# Patient Record
Sex: Male | Born: 1950 | Race: White | Hispanic: No | Marital: Married | Smoking: Former smoker
Health system: Southern US, Community
[De-identification: ages and names within clinical notes are randomized; demographics above are authoritative.]

## PROBLEM LIST (undated history)

## (undated) DIAGNOSIS — J986 Disorders of diaphragm: Secondary | ICD-10-CM

## (undated) DIAGNOSIS — F329 Major depressive disorder, single episode, unspecified: Secondary | ICD-10-CM

## (undated) DIAGNOSIS — E119 Type 2 diabetes mellitus without complications: Secondary | ICD-10-CM

## (undated) DIAGNOSIS — E785 Hyperlipidemia, unspecified: Secondary | ICD-10-CM

## (undated) DIAGNOSIS — I219 Acute myocardial infarction, unspecified: Secondary | ICD-10-CM

## (undated) DIAGNOSIS — I2510 Atherosclerotic heart disease of native coronary artery without angina pectoris: Secondary | ICD-10-CM

## (undated) HISTORY — DX: Acute myocardial infarction, unspecified: I21.9

## (undated) HISTORY — PX: CARDIAC CATHETERIZATION: SHX172

## (undated) HISTORY — DX: Disorders of diaphragm: J98.6

## (undated) HISTORY — DX: Atherosclerotic heart disease of native coronary artery without angina pectoris: I25.10

## (undated) HISTORY — DX: Hyperlipidemia, unspecified: E78.5

## (undated) HISTORY — DX: Major depressive disorder, single episode, unspecified: F32.9

---

## 1993-09-23 DIAGNOSIS — F32A Depression, unspecified: Secondary | ICD-10-CM

## 1993-09-23 HISTORY — DX: Depression, unspecified: F32.A

## 1993-09-23 HISTORY — PX: CORONARY ARTERY BYPASS GRAFT: SHX141

## 1998-09-09 ENCOUNTER — Encounter: Payer: Self-pay | Admitting: Emergency Medicine

## 1998-09-09 ENCOUNTER — Inpatient Hospital Stay (HOSPITAL_COMMUNITY): Admission: EM | Admit: 1998-09-09 | Discharge: 1998-09-12 | Payer: Self-pay | Admitting: Emergency Medicine

## 1998-09-13 ENCOUNTER — Encounter: Payer: Self-pay | Admitting: Cardiology

## 1998-09-13 ENCOUNTER — Observation Stay (HOSPITAL_COMMUNITY): Admission: EM | Admit: 1998-09-13 | Discharge: 1998-09-14 | Payer: Self-pay | Admitting: Emergency Medicine

## 1998-11-11 ENCOUNTER — Encounter: Payer: Self-pay | Admitting: Emergency Medicine

## 1998-11-11 ENCOUNTER — Inpatient Hospital Stay (HOSPITAL_COMMUNITY): Admission: EM | Admit: 1998-11-11 | Discharge: 1998-11-13 | Payer: Self-pay | Admitting: Emergency Medicine

## 1998-11-12 ENCOUNTER — Encounter: Payer: Self-pay | Admitting: Cardiology

## 1998-11-14 ENCOUNTER — Observation Stay (HOSPITAL_COMMUNITY): Admission: AD | Admit: 1998-11-14 | Discharge: 1998-11-15 | Payer: Self-pay | Admitting: Cardiology

## 2001-01-25 ENCOUNTER — Encounter: Payer: Self-pay | Admitting: Emergency Medicine

## 2001-01-25 ENCOUNTER — Emergency Department (HOSPITAL_COMMUNITY): Admission: EM | Admit: 2001-01-25 | Discharge: 2001-01-25 | Payer: Self-pay | Admitting: Emergency Medicine

## 2004-05-07 ENCOUNTER — Ambulatory Visit (HOSPITAL_COMMUNITY): Admission: RE | Admit: 2004-05-07 | Discharge: 2004-05-07 | Payer: Self-pay | Admitting: Family Medicine

## 2007-05-19 ENCOUNTER — Ambulatory Visit (HOSPITAL_BASED_OUTPATIENT_CLINIC_OR_DEPARTMENT_OTHER): Admission: RE | Admit: 2007-05-19 | Discharge: 2007-05-19 | Payer: Self-pay | Admitting: Cardiology

## 2007-05-25 ENCOUNTER — Ambulatory Visit: Payer: Self-pay | Admitting: Internal Medicine

## 2007-06-11 ENCOUNTER — Encounter: Payer: Self-pay | Admitting: Cardiology

## 2008-06-01 ENCOUNTER — Ambulatory Visit (HOSPITAL_BASED_OUTPATIENT_CLINIC_OR_DEPARTMENT_OTHER): Admission: RE | Admit: 2008-06-01 | Discharge: 2008-06-01 | Payer: Self-pay | Admitting: Internal Medicine

## 2008-06-04 ENCOUNTER — Ambulatory Visit: Payer: Self-pay | Admitting: Internal Medicine

## 2008-08-09 ENCOUNTER — Ambulatory Visit (HOSPITAL_BASED_OUTPATIENT_CLINIC_OR_DEPARTMENT_OTHER): Admission: RE | Admit: 2008-08-09 | Discharge: 2008-08-09 | Payer: Self-pay | Admitting: Internal Medicine

## 2008-08-12 ENCOUNTER — Ambulatory Visit: Payer: Self-pay | Admitting: Internal Medicine

## 2009-05-14 ENCOUNTER — Inpatient Hospital Stay (HOSPITAL_COMMUNITY): Admission: EM | Admit: 2009-05-14 | Discharge: 2009-05-18 | Payer: Self-pay | Admitting: Emergency Medicine

## 2009-06-01 ENCOUNTER — Encounter: Payer: Self-pay | Admitting: Cardiology

## 2009-06-08 ENCOUNTER — Encounter (HOSPITAL_COMMUNITY): Admission: RE | Admit: 2009-06-08 | Discharge: 2009-08-22 | Payer: Self-pay | Admitting: Cardiovascular Disease

## 2009-06-29 DIAGNOSIS — E785 Hyperlipidemia, unspecified: Secondary | ICD-10-CM

## 2009-07-03 ENCOUNTER — Encounter: Payer: Self-pay | Admitting: Cardiology

## 2009-07-10 ENCOUNTER — Encounter: Payer: Self-pay | Admitting: Cardiology

## 2009-07-20 ENCOUNTER — Telehealth (INDEPENDENT_AMBULATORY_CARE_PROVIDER_SITE_OTHER): Payer: Self-pay | Admitting: *Deleted

## 2009-07-26 ENCOUNTER — Ambulatory Visit: Payer: Self-pay | Admitting: Cardiology

## 2009-09-20 ENCOUNTER — Encounter (INDEPENDENT_AMBULATORY_CARE_PROVIDER_SITE_OTHER): Payer: Self-pay | Admitting: *Deleted

## 2009-09-29 ENCOUNTER — Ambulatory Visit: Payer: Self-pay | Admitting: Internal Medicine

## 2009-09-29 DIAGNOSIS — E559 Vitamin D deficiency, unspecified: Secondary | ICD-10-CM | POA: Insufficient documentation

## 2009-09-29 DIAGNOSIS — H6980 Other specified disorders of Eustachian tube, unspecified ear: Secondary | ICD-10-CM

## 2009-09-29 DIAGNOSIS — F329 Major depressive disorder, single episode, unspecified: Secondary | ICD-10-CM | POA: Insufficient documentation

## 2009-09-29 DIAGNOSIS — N4289 Other specified disorders of prostate: Secondary | ICD-10-CM | POA: Insufficient documentation

## 2009-11-27 ENCOUNTER — Encounter: Admission: RE | Admit: 2009-11-27 | Discharge: 2009-11-27 | Payer: Self-pay | Admitting: Cardiovascular Disease

## 2009-11-28 ENCOUNTER — Ambulatory Visit (HOSPITAL_COMMUNITY): Admission: RE | Admit: 2009-11-28 | Discharge: 2009-11-29 | Payer: Self-pay | Admitting: Cardiovascular Disease

## 2009-11-30 ENCOUNTER — Encounter (HOSPITAL_COMMUNITY): Admission: RE | Admit: 2009-11-30 | Discharge: 2010-01-22 | Payer: Self-pay | Admitting: Cardiovascular Disease

## 2010-02-23 ENCOUNTER — Encounter: Payer: Self-pay | Admitting: Internal Medicine

## 2010-02-26 ENCOUNTER — Ambulatory Visit: Payer: Self-pay | Admitting: Internal Medicine

## 2010-02-26 DIAGNOSIS — H9190 Unspecified hearing loss, unspecified ear: Secondary | ICD-10-CM | POA: Insufficient documentation

## 2010-02-28 ENCOUNTER — Inpatient Hospital Stay (HOSPITAL_COMMUNITY): Admission: RE | Admit: 2010-02-28 | Discharge: 2010-03-01 | Payer: Self-pay | Admitting: Cardiovascular Disease

## 2010-05-10 ENCOUNTER — Encounter: Payer: Self-pay | Admitting: Internal Medicine

## 2010-05-15 ENCOUNTER — Telehealth (INDEPENDENT_AMBULATORY_CARE_PROVIDER_SITE_OTHER): Payer: Self-pay | Admitting: *Deleted

## 2010-05-16 ENCOUNTER — Ambulatory Visit: Payer: Self-pay | Admitting: Internal Medicine

## 2010-05-18 ENCOUNTER — Telehealth (INDEPENDENT_AMBULATORY_CARE_PROVIDER_SITE_OTHER): Payer: Self-pay | Admitting: *Deleted

## 2010-05-18 LAB — CONVERTED CEMR LAB: Hgb A1c MFr Bld: 8.8 % — ABNORMAL HIGH (ref 4.6–6.5)

## 2010-05-22 ENCOUNTER — Ambulatory Visit: Payer: Self-pay | Admitting: Internal Medicine

## 2010-05-22 DIAGNOSIS — IMO0001 Reserved for inherently not codable concepts without codable children: Secondary | ICD-10-CM

## 2010-07-10 ENCOUNTER — Encounter: Payer: Self-pay | Admitting: Internal Medicine

## 2010-07-13 ENCOUNTER — Ambulatory Visit: Payer: Self-pay | Admitting: Internal Medicine

## 2010-07-13 ENCOUNTER — Telehealth: Payer: Self-pay | Admitting: Internal Medicine

## 2010-07-16 LAB — CONVERTED CEMR LAB
CO2: 29 meq/L (ref 19–32)
Creatinine,U: 145.2 mg/dL
Hgb A1c MFr Bld: 6.2 % (ref 4.6–6.5)
Microalb Creat Ratio: 0.4 mg/g (ref 0.0–30.0)
Microalb, Ur: 0.6 mg/dL (ref 0.0–1.9)
Potassium: 4.7 meq/L (ref 3.5–5.1)
Sodium: 142 meq/L (ref 135–145)

## 2010-07-25 ENCOUNTER — Ambulatory Visit: Payer: Self-pay | Admitting: Internal Medicine

## 2010-07-25 DIAGNOSIS — E1159 Type 2 diabetes mellitus with other circulatory complications: Secondary | ICD-10-CM | POA: Insufficient documentation

## 2010-08-23 ENCOUNTER — Encounter: Payer: Self-pay | Admitting: Internal Medicine

## 2010-08-30 ENCOUNTER — Inpatient Hospital Stay (HOSPITAL_COMMUNITY): Admission: EM | Admit: 2010-08-30 | Discharge: 2010-06-15 | Payer: Self-pay | Admitting: Emergency Medicine

## 2010-10-18 ENCOUNTER — Ambulatory Visit: Admit: 2010-10-18 | Payer: Self-pay | Admitting: Internal Medicine

## 2010-10-22 ENCOUNTER — Ambulatory Visit
Admission: RE | Admit: 2010-10-22 | Discharge: 2010-10-22 | Payer: Self-pay | Source: Home / Self Care | Attending: Internal Medicine | Admitting: Internal Medicine

## 2010-10-22 ENCOUNTER — Other Ambulatory Visit: Payer: Self-pay | Admitting: Internal Medicine

## 2010-10-25 ENCOUNTER — Ambulatory Visit: Admit: 2010-10-25 | Payer: Self-pay | Admitting: Internal Medicine

## 2010-10-25 ENCOUNTER — Encounter: Payer: Self-pay | Admitting: Internal Medicine

## 2010-10-25 ENCOUNTER — Ambulatory Visit (INDEPENDENT_AMBULATORY_CARE_PROVIDER_SITE_OTHER): Payer: Commercial Managed Care - PPO | Admitting: Internal Medicine

## 2010-10-25 DIAGNOSIS — E119 Type 2 diabetes mellitus without complications: Secondary | ICD-10-CM

## 2010-10-25 NOTE — Assessment & Plan Note (Signed)
Summary: to discuss labs///sph   Vital Signs:  Patient profile:   60 year old male Weight:      170.6 pounds BMI:     28.49 Pulse rate:   60 / minute Resp:     12 per minute BP sitting:   98 / 58  (left arm) Cuff size:   large  Vitals Entered By: Shonna Chock CMA (July 25, 2010 9:15 AM) CC: Follow-up visit: discuss labs, Type 2 diabetes mellitus follow-up   Primary Care Provider:  Dr. Dayton Scrape.[facility]  CC:  Follow-up visit: discuss labs and Type 2 diabetes mellitus follow-up.  History of Present Illness: Type 2 Diabetes Mellitus Follow-Up      This is a 60 year old man who presents for Type 2 diabetes mellitus follow-up.  The patient reports self managed hypoglycemia(he stopped Metformin XL 500 mg in mid Sept pre catheterization & has restrted it), but denies polyuria, polydipsia, blurred vision, weight loss, weight gain, and numbness of extremities.  Other symptoms include chest pain  6 weeks ago  ; S/P angioplasty. Brachytherapy & angioplasty 07/18/2010 by Dr Gloriajean Dell, [facility]. The patient denies the following symptoms: neuropathic pain, vomiting, orthostatic symptoms, poor wound healing, intermittent claudication, vision loss, and foot ulcer.  Since the last visit the patient reports good dietary compliance ( no HFCS & hyperglycemic carbs).  The patient has been measuring capillary blood glucose before breakfast( 100 -133),  2 hrs after lunch or  after dinner ( < 140).  Macular Degeneration diagnosed by Dr Burgess Estelle, [facility], 06/2010. A1c 6.2% ( average sugar 132 & 24% risk); A1c initially was 8.6%( average sugar 200 & risk 72%).  Allergies (verified): No Known Drug Allergies  Review of Systems Psych:  Complains of easily angered, easily tearful, and irritability; denies anxiety and panic attacks; "I'm in a funk". Dr Jennelle Human last seen 02/2010.  Physical Exam  General:  well-nourished;alert,appropriate and cooperative throughout examination Lungs:  Normal  respiratory effort, chest expands symmetrically. Lungs are clear to auscultation, no crackles or wheezes. Heart:  regular rhythm, no murmur, no gallop, no rub, no JVD, no HJR, and bradycardia.   Abdomen:  Bowel sounds positive,abdomen soft and non-tender without masses, organomegaly or hernias noted. No AAA or bruits Pulses:  R and L carotid,radial,dorsalis pedis and posterior tibial pulses are full and equal bilaterally Extremities:  No clubbing, cyanosis, edema, or deformity noted . Good nail health Neurologic:  alert & oriented X3 and sensation intact to light touch over feet.   Skin:  Intact without suspicious lesions or rashes Psych:  normally interactive and good eye contact.   Focused & motivated   Impression & Recommendations:  Problem # 1:  DIABETES MELLITUS, CONTROLLED (ICD-250.00)  The following medications were removed from the medication list:    Glimepiride 2 Mg Tabs (Glimepiride) .[name]... 1 every am    Metformin Hcl 500 Mg Xr24h-tab (Metformin hcl) .[name]... 1 once daily with largest meal(do not take if having xrays with "dye") His updated medication list for this problem includes:    Altace 5 Mg Caps (Ramipril) .[name]... 1 by mouth once daily    Aspirin Ec 325 Mg Tbec (Aspirin) .[name]... Take one tablet by mouth daily as needed    Metformin Hcl 500 Mg Xr24h-tab (Metformin hcl) .[name]... 1 with largest meal  Problem # 2:  DEPRESSION (ICD-311)  Complete Medication List: 1)  Niaspan 1000 Mg Cr-tabs (Niacin (antihyperlipidemic)) .[name].. 1 by mouth at bedtime 2)  Altace 5 Mg Caps (Ramipril) .Marland KitchenMarland KitchenMarland Kitchen  1 by mouth once daily 3)  Lipitor 20 Mg Tabs (Atorvastatin calcium) .[name].. 1 by mouth once daily as needed 4)  Aspirin Ec 325 Mg Tbec (Aspirin) .... Take one tablet by mouth daily as needed 5)  Lamictal 200 Mg Tabs (Lamotrigine) .[name].. 1 tab once daily 6)  Effient 10 Mg Tabs (Prasugrel hcl) .[name].. 1 tab once daily 7)  Nitrostat 0.4 Mg Subl (Nitroglycerin) .[name].. 1 tablet under tongue at onset of chest pain; you  may repeat every 5 minutes for up to 3 doses. 8)  Propecia 1 Mg Tabs (Finasteride) .[name].. 1 tab once daily 9)  Vitamin D3 5000 Unit Caps (Cholecalciferol) .[name].. 1 by mouth once daily 10)  Onetouch Ultra Test Strp (Glucose blood) .... Check bloodsugar daily,  dx:250.02 11)  Onetouch Lancets Misc (Lancets) .... Check bloodsugar daily dx: 250.02 12)  Isosorbide Dinitrate 30 Mg Tabs (Isosorbide dinitrate) .... 1/2 by mouth once daily 13)  Bystolic 5 Mg Tabs (Nebivolol hcl) .[name].. 1 by mouth once daily 14)  Coq10 100 Mg Caps (Coenzyme q10) .[name].. 1 by mouth once daily 15)  Metformin Hcl 500 Mg Xr24h-tab (Metformin hcl) .Marland KitchenMarland Kitchen. 1 with largest meal  Patient Instructions: 1)  See Dr Jennelle Human to assess Lamictil .  2)  Please schedule a follow-up appointment in 3 months. 3)  Check your blood sugars regularly. If your readings are usually above :150 or below 90 you should contact our office. 4)  Check your feet each night for sore areas, calluses or signs of infection. 5)  HbgA1C prior to visit, ICD-9:250.00 Prescriptions: METFORMIN HCL 500 MG XR24H-TAB (METFORMIN HCL) 1 with largest meal  #90 x 1   Entered and Authorized by:   Marga Melnick MD   Signed by:   Marga Melnick MD on 07/25/2010   Method used:   Print then Give to Patient   RxID:   [id number]    Orders Added: 1)  Est. Patient Level IV [14782]

## 2010-10-25 NOTE — Letter (Signed)
Summary: [facility]   Imported By: Lanelle Bal 07/17/2010 10:33:41  _____________________________________________________________________  External Attachment:    Type:   Image     Comment:   External Document

## 2010-10-25 NOTE — Progress Notes (Signed)
Summary: lab appt [phone] at [facility]  ---- Converted from flag ---- ---- 05/15/2010 11:15 AM, Okey Regal Spring wrote: lab appt scheduled [id number] [facility] - dx 790.29 per chrae  ---- 05/15/2010 9:55 AM, Okey Regal Spring wrote: [facility] TO SCHEDULE LAB APPT  ---- 05/15/2010 6:44 AM, Marga Melnick MD wrote: appt please; glucose was 215 & A1c 7.9% @ Cardiologist's office;uncontrolled DM present ------------------------------

## 2010-10-25 NOTE — Assessment & Plan Note (Signed)
Summary: right earache//lch   Vital Signs:  Patient profile:   60 year old male Weight:      182.2 pounds Temp:     97.9 degrees F oral Pulse rate:   60 / minute Resp:     15 per minute BP sitting:   100 / 62  (left arm) Cuff size:   large  Vitals Entered By: Shonna Chock (February 26, 2010 1:55 PM) CC: Right ear is very painful and patient is unable to hear x 1 1/2 week(s) Comments REVIEWED MED LIST, PATIENT AGREED DOSE AND INSTRUCTION CORRECT  **Taking two meds for pending surgery (Cardiac Cath) on Wed-? Name**   Primary Care Provider:  Dr. Dayton Scrape.[facility]  CC:  Right ear is very painful and patient is unable to hear x 1 1/2 week(s).  History of Present Illness: Onset 10 days ago as soreness R ear  ; [place]. Progressive pain  since with hearing  loss as of 06/04. Rx: Q tip , olive oil , heating pad, ear candle  Allergies (verified): No Known Drug Allergies  Review of Systems General:  Denies chills, fever, and sweats. ENT:  Complains of ringing in ears and sore throat; denies nasal congestion and sinus pressure; No frontal headache , facial pain or purulence.  Physical Exam  General:  well-nourished,in no acute distress; alert,appropriate and cooperative throughout examination Ears:  Impaction on R ; L TM WNL Nose:  External nasal examination shows no deformity or inflammation. Nasal mucosa are pink and moist without lesions or exudates. Mouth:  Oral mucosa and oropharynx without lesions or exudates.  Teeth in good repair. Cervical Nodes:  No lymphadenopathy noted Axillary Nodes:  No palpable lymphadenopathy   Impression & Recommendations:  Problem # 1:  EAR PAIN, RIGHT (ICD-388.70)  Problem # 2:  HEARING LOSS, RIGHT EAR (ICD-389.9)  Problem # 3:  CERUMEN IMPACTION (ICD-380.4)  Complete Medication List: 1)  Metoprolol Succinate 25 Mg Xr24h-tab (Metoprolol succinate) .[name].. 1 by mouth once daily 2)  Niaspan 1000 Mg  Cr-tabs (Niacin (antihyperlipidemic)) .... 2 by mouth at bedtime 3)  Altace 10 Mg Caps (Ramipril) .[name].. 1 by mouth once daily 4)  Lipitor 40 Mg Tabs (Atorvastatin calcium) .[name].. 1 tab once daily 5)  Aspirin Ec 325 Mg Tbec (Aspirin) .... Take one tablet by mouth daily 6)  Lamictal 200 Mg Tabs (Lamotrigine) .[name].. 1 tab once daily 7)  Effient 10 Mg Tabs (Prasugrel hcl) .[name].. 1 tab once daily 8)  Nitrostat 0.4 Mg Subl (Nitroglycerin) .[name].. 1 tablet under tongue at onset of chest pain; you may repeat every 5 minutes for up to 3 doses. 9)  Propecia 1 Mg Tabs (Finasteride) .[name].. 1 tab once daily 10)  Vitamin D3 1000 Unit Tabs (Cholecalciferol) .[name].. 1 by mouth once daily  Patient Instructions: 1)  2-3 drops of mineral  oil at bedtime  then cotton ball. H2O2 in am for 10 min then shower with thin wasrag as "wick". Report Warning Signs as discussed.

## 2010-10-25 NOTE — Progress Notes (Signed)
Summary: Add on lab  Phone Note Call from Patient   Summary of Call: Pt was at [facility] lab with order from another MD for bmet, v58.69 and 427.89. I advised [facility] that it is ok to order and added the lab to idx schedule. Initial call taken by: Lucious Groves CMA,  July 13, 2010 8:22 AM

## 2010-10-25 NOTE — Letter (Signed)
Summary: [facility]   Imported By: Lanelle Bal 09/19/2010 07:58:56  _____________________________________________________________________  External Attachment:    Type:   Image     Comment:   External Document

## 2010-10-25 NOTE — Assessment & Plan Note (Signed)
Summary: NEW TO EST,[facility] INS/RH.....   Vital Signs:  Patient profile:   60 year old male Height:      65 inches Weight:      182.13 pounds Pulse rate:   64 / minute Resp:     14 per minute BP sitting:   120 / 60  Vitals Entered By: Kandice Hams (September 29, 2009 12:57 PM)  CC: NEW PT TO EST, General Medical Evaluation   Primary Care Provider:  Dr. Dayton Scrape.[facility]  CC:  NEW PT TO EST and General Medical Evaluation.  History of Present Illness: Seth Sanchez is here for a physical; his only issue is suboptimal draining of R ear after showering. Dr Tresa Endo checked [facility] Panel 8 weeks ago. Total cholesterol 110; ? LDL & HDL values.  Preventive Screening-Counseling & Management  Alcohol-Tobacco     Smoking Status: quit  Caffeine-Diet-Exercise     Does Patient Exercise: yes  Allergies (verified): No Known Drug Allergies  Past History:  Past Medical History: MI/ DIAPHRAGMATIC (ICD-410.90) 04/2009, Dr Daphene Jaeger CAD, ARTERY BYPASS GRAFT/1995 (ICD-414.04) DYSLIPIDEMIA (ICD-272.4) Depression, Dr Jennelle Human, [facility]  Vitamin D deficiency  Past Surgical History: CABG 1995 with a right coronary artery stent placed in December 1999.  HEART ATTACK 2010     STENT (2) 1998 CARDIAC CATH  X 5 No Colonoscopy due to prior  issurance issues (SOC reviewed; he would need Cardiac clearance from Dr Tresa Endo)  Family History: Father: lung CA Mother:pacer  Siblings: neg  Social History: Occupation:Self Employed Married Former Smoker:quit permanently 2008 Alcohol use-no Regular exercise-yes: walks 1-2X/week 3/4 mpd Smoking Status:  quit Does Patient Exercise:  yes  Review of Systems  The patient denies anorexia, fever, vision loss, syncope, prolonged cough, headaches, hemoptysis, abdominal pain, melena, hematochezia, severe indigestion/heartburn, hematuria, incontinence, suspicious skin lesions, unusual weight change, abnormal bleeding, enlarged lymph nodes, and angioedema.          Weight up 10# over 4 months. Intermittent depression since onset of CAD in 1995. ENT:  See HPI; Complains of postnasal drainage and ringing in ears; denies decreased hearing, difficulty swallowing, ear discharge, earache, hoarseness, nasal congestion, and sinus pressure; No purulence. CV:  Complains of shortness of breath with exertion; denies chest pain or discomfort, leg cramps with exertion, palpitations, swelling of feet, and swelling of hands; DOE ? from Beta blocker.  Physical Exam  General:  well-nourished,in no acute distress; alert,appropriate and cooperative throughout examination Head:  Normocephalic and atraumatic without obvious abnormalities.  Eyes:  No corneal or conjunctival inflammation noted. Perrla. Funduscopic exam benign, without hemorrhages, exudates or papilledema.  Ears:  External ear exam shows no significant lesions or deformities.  Otoscopic examination reveals clear canals, tympanic membranes are intact bilaterally without bulging, retraction, inflammation or discharge. Hearing is grossly normal bilaterally. Tuning fork exam WNLSlight wax R canal Nose:  External nasal examination shows no deformity or inflammation. Nasal mucosa are pink and moist without lesions or exudates. Mouth:  Oral mucosa and oropharynx without lesions or exudates.  Teeth in good repair. Neck:  No deformities, masses, or tenderness noted. Lungs:  Normal respiratory effort, chest expands symmetrically. Lungs are clear to auscultation, no crackles or wheezes. Heart:  Normal rate and regular rhythm. S1 and S2 normal without gallop, murmur, click, rub . S4 with slight slurring @ R base Abdomen:  Bowel sounds positive,abdomen soft and non-tender without masses, organomegaly or hernias noted. Rectal:  No external abnormalities noted. Normal sphincter tone. No rectal masses or tenderness. Genitalia:  Testes  bilaterally descended without nodularity, tenderness or masses. No scrotal masses or  lesions. No penis lesions or urethral discharge. L varicocele.   Prostate:  L asymmetrical  ridging w/o nodule Msk:  No deformity or scoliosis noted of thoracic or lumbar spine.   Pulses:  R and L carotid,radial,dorsalis pedis and posterior tibial pulses are full and equal bilaterally Extremities:  No clubbing, cyanosis, edema, or deformity noted with normal full range of motion of all joints.   Neurologic:  alert & oriented X3 and DTRs symmetrical and normal.   Skin:  Intact without suspicious lesions or rashes Cervical Nodes:  No lymphadenopathy noted Axillary Nodes:  No palpable lymphadenopathy Psych:  memory intact for recent and remote, normally interactive, and good eye contact.     Impression & Recommendations:  Problem # 1:  PREVENTIVE HEALTH CARE (ICD-V70.0)  Problem # 2:  EUSTACHIAN TUBE DYSFUNCTION, RIGHT (ICD-381.81)  Problem # 3:  PROSTATE ASYMMETRY, LEFT (ICD-602.8)  Problem # 4:  CAD, ARTERY BYPASS GRAFT/1995 (ICD-414.04)  His updated medication list for this problem includes:    Metoprolol Tartrate 25 Mg Tabs (Metoprolol tartrate) .[name]... 1 tab two times a day    Altace 5 Mg Caps (Ramipril) .[name]... 1 by mouth once daily    Aspirin Ec 325 Mg Tbec (Aspirin) .[name]... Take one tablet by mouth daily    Effient 10 Mg Tabs (Prasugrel hcl) .[name]... 1 tab once daily    Nitrostat 0.4 Mg Subl (Nitroglycerin) .[name]... 1 tablet under tongue at onset of chest pain; you may repeat every 5 minutes for up to 3 doses.  Problem # 5:  DYSLIPIDEMIA (ICD-272.4)  His updated medication list for this problem includes:    Niaspan 1000 Mg Cr-tabs (Niacin (antihyperlipidemic)) .[name]... 1 every bedtime    Lipitor 40 Mg Tabs (Atorvastatin calcium) .[name]... 1 tab once daily  Problem # 6:  VITAMIN D DEFICIENCY (ICD-268.9)  Complete Medication List: 1)  Metoprolol Tartrate 25 Mg Tabs (Metoprolol tartrate) .[name].. 1 tab two times a day 2)  Niaspan 1000 Mg Cr-tabs (Niacin (antihyperlipidemic)) .[name].. 1 every  bedtime 3)  Altace 5 Mg Caps (Ramipril) .[name].. 1 by mouth once daily 4)  Lipitor 40 Mg Tabs (Atorvastatin calcium) .[name].. 1 tab once daily 5)  Aspirin Ec 325 Mg Tbec (Aspirin) .... Take one tablet by mouth daily 6)  Lamictal 200 Mg Tabs (Lamotrigine) .[name].. 1 tab once daily 7)  Effient 10 Mg Tabs (Prasugrel hcl) .[name].. 1 tab once daily 8)  Nitrostat 0.4 Mg Subl (Nitroglycerin) .[name].. 1 tablet under tongue at onset of chest pain; you may repeat every 5 minutes for up to 3 doses. 9)  Propecia 1 Mg Tabs (Finasteride) .[name].. 1 tab once daily  Patient Instructions: 1)  These tests are recommended if not done within  6 months: 2)  BMP ;vitamin D level;TSH;CBC w/ Diff;PSA; vit D level . Discuss a Screening Colonoscopy with Dr Tresa Endo. Use Nasonex two times a day  as directed . Go to Web MD for Eustachian Tube Dysfunction.

## 2010-10-25 NOTE — Assessment & Plan Note (Signed)
Summary: FOLLOWUP ON LABS////SPH   Vital Signs:  Patient profile:   60 year old male Weight:      173.6 pounds BMI:     28.99 Pulse rate:   56 / minute Resp:     14 per minute BP sitting:   102 / 64  (left arm) Cuff size:   large  Vitals Entered By: Shonna Chock CMA (May 22, 2010 8:02 AM) CC: Follow-up visit: copy of labs given, Type 2 diabetes mellitus follow-up   Primary Care Emry Tobin:  Dr. Dayton Scrape.[facility]  CC:  Follow-up visit: copy of labs given and Type 2 diabetes mellitus follow-up.  History of Present Illness: Type 2 Diabetes Mellitus Follow-Up      This is a 60 year old man who presents for Type 2 diabetes mellitus follow-up. DM was suggested by glucoses > 200 @ Cardilogist & confirmed by A1c of 8.8%. Significance of A1c > 7% discussed in reference to average glucose & long term risk . The patient reports polydipsia & polyphagia , but denies polyuria, blurred vision, weight loss, weight gain, and numbness of extremities.  Other symptoms include occasional orthostatic symptoms.  The patient denies the following symptoms: neuropathic pain, chest pain, vomiting, poor wound healing, intermittent claudication (he does have  leg cramps 2X/ weeek over past month), vision loss, and foot ulcer.  Since the last visit the patient reports good dietary compliance ( low fat), not exercising regularly, and not monitoring blood glucose.  Since the last visit, the patient reports having had no eye care and no foot care.  His DM is in the context of  ASCVD,S/P 2 vessel bypass in 1994.On 05/21/2010 he had "shakes" 90 min after eggs & [place] sausage. This responded to Diet Coke & Naps. Concept of glycemic index & load & role of High Fructose Corn Syrup in Diabetes discussed with him & his wife. Meds reviewed @ their request in reference to impact on DM. Glucose monitoring taught.  Preventive Screening-Counseling & Management  Caffeine-Diet-Exercise     Does Patient Exercise:  no  Current Medications (verified): 1)  Metoprolol Succinate 25 Mg Xr24h-Tab (Metoprolol Succinate) .[name].. 1 By Mouth Once Daily 2)  Niaspan 1000 Mg Cr-Tabs (Niacin (Antihyperlipidemic)) .[name].. 1 By Mouth At Bedtime 3)  Altace 5 Mg Caps (Ramipril) .[name].. 1 By Mouth Once Daily 4)  Lipitor 20 Mg Tabs (Atorvastatin Calcium) .[name].. 1 By Mouth Once Daily 5)  Aspirin Ec 325 Mg Tbec (Aspirin) .... Take One Tablet By Mouth Daily 6)  Lamictal 200 Mg Tabs (Lamotrigine) .[name].. 1 Tab Once Daily 7)  Effient 10 Mg Tabs (Prasugrel Hcl) .[name].. 1 Tab Once Daily 8)  Nitrostat 0.4 Mg Subl (Nitroglycerin) .[name].. 1 Tablet Under Tongue At Onset of Chest Pain; You May Repeat Every 5 Minutes For Up To 3 Doses. 9)  Propecia 1 Mg Tabs (Finasteride) .[name].. 1 Tab Once Daily 10)  Vitamin D3 1000 Unit Tabs (Cholecalciferol) .[name].. 1 By Mouth Once Daily 11)  Glimepiride 2 Mg Tabs (Glimepiride) .[name].. 1 Every Am 12)  Metformin Hcl 500 Mg Xr24h-Tab (Metformin Hcl) .[name].. 1 Once Daily With Largest Meal(Do Not Take If Having Xrays With "dye")  Allergies (verified): No Known Drug Allergies  Family History: Father: lung  cancer Mother:pacer ,AODM (on insulin) Siblings: negative  Social History: Occupation:Self Employed Married Former Smoker:quit permanently 2008 Alcohol use-no Regular exercise-no Does Patient Exercise:  no  Physical Exam  General:  well-nourished; alert,appropriate and cooperative throughout examination Lungs:  Normal respiratory effort, chest expands  symmetrically. Lungs are clear to auscultation, no crackles or wheezes. Heart:  Normal rate and regular rhythm. S1 and S2 normal without gallop, murmur, click, rub or other extra sounds. Abdomen:  Bowel sounds positive,abdomen soft and non-tender without masses, organomegaly or hernias noted. No AAA or bruits Pulses:  R and L carotid,radial,dorsalis pedis and posterior tibial pulses are full and equal bilaterally Extremities:  No clubbing, cyanosis, edema. Good nail  health. Neg Homan's  Neurologic:  sensation intact to light touch over feet.   Skin:  Intact without suspicious lesions or rashes Psych:  memory intact for recent and remote, normally interactive, and good eye contact.     Impression & Recommendations:  Problem # 1:  DIABETES MELLITUS, UNCONTROLLED (ICD-250.02) Note :face to face  encompassed  31 min with > 15 min on Diabetic counselling ; additional 15 min of Glucose monitoring teaching by Staff His updated medication list for this problem includes:    Altace 5 Mg Caps (Ramipril) .[name]... 1 by mouth once daily    Aspirin Ec 325 Mg Tbec (Aspirin) .[name]... Take one tablet by mouth daily    Glimepiride 2 Mg Tabs (Glimepiride) .[name]... 1 every am    Metformin Hcl 500 Mg Xr24h-tab (Metformin hcl) .[name]... 1 once daily with largest meal(do not take if having xrays with "dye")  Problem # 2:  MUSCLE PAIN (ICD-729.1) Nocturnal cramps 2X/week His updated medication list for this problem includes:    Aspirin Ec 325 Mg Tbec (Aspirin) .[name]... Take one tablet by mouth daily  Complete Medication List: 1)  Metoprolol Succinate 25 Mg Xr24h-tab (Metoprolol succinate) .[name].. 1 by mouth once daily 2)  Niaspan 1000 Mg Cr-tabs (Niacin (antihyperlipidemic)) .[name].. 1 by mouth at bedtime 3)  Altace 5 Mg Caps (Ramipril) .[name].. 1 by mouth once daily 4)  Lipitor 20 Mg Tabs (Atorvastatin calcium) .[name].. 1 by mouth once daily 5)  Aspirin Ec 325 Mg Tbec (Aspirin) .... Take one tablet by mouth daily 6)  Lamictal 200 Mg Tabs (Lamotrigine) .[name].. 1 tab once daily 7)  Effient 10 Mg Tabs (Prasugrel hcl) .[name].. 1 tab once daily 8)  Nitrostat 0.4 Mg Subl (Nitroglycerin) .[name].. 1 tablet under tongue at onset of chest pain; you may repeat every 5 minutes for up to 3 doses. 9)  Propecia 1 Mg Tabs (Finasteride) .[name].. 1 tab once daily 10)  Vitamin D3 1000 Unit Tabs (Cholecalciferol) .[name].. 1 by mouth once daily 11)  Glimepiride 2 Mg Tabs (Glimepiride) .[name].. 1 every am 12)  Metformin Hcl 500 Mg Xr24h-tab  (Metformin hcl) .[name].. 1 once daily with largest meal(do not take if having xrays with "dye") 13)  Onetouch Ultra Test Strp (Glucose blood) .... Check bloodsugar daily,  dx:250.02 14)  Onetouch Lancets Misc (Lancets) .... Check bloodsugar daily dx: 250.02  Patient Instructions: 1)  Cal/ Mag at bedtime as needed for leg cramps. 2)  It is important that you exercise regularly at least 20 minutes 5 times a week. If you develop chest pain, have severe difficulty breathing, or feel very tired , stop exercising immediately and seek medical attention. 3)  Check your blood sugars regularly. Your goals = fasting glucose 90-150 & < 180 two after largest meals. Consume < 40 grams of High Fructose Corn Sugar / day as discussed. 4)  Check your feet each night for sore areas, calluses or signs of infection. 5)  Please schedule a follow-up appointment in 7 weeks. 6)  BUN,creat, K+ prior to visit, ICD-9:250.02 7)  HbgA1C prior to visit,  ICD-9:250.02 8)  Urine Microalbumin prior to visit, ICD-9:250.02 Prescriptions: ONETOUCH LANCETS  MISC (LANCETS) Check bloodsugar daily DX: 250.02  #100 x 3   Entered by:   Shonna Chock CMA   Authorized by:   Marga Melnick MD   Signed by:   Shonna Chock CMA on 05/22/2010   Method used:   Print then Give to Patient   RxID:   [id number] ONETOUCH ULTRA TEST  STRP (GLUCOSE BLOOD) check bloodsugar daily,  DX:250.02  #100 x 3   Entered by:   Shonna Chock CMA   Authorized by:   Marga Melnick MD   Signed by:   Shonna Chock CMA on 05/22/2010   Method used:   Print then Give to Patient   RxID:   [id number]

## 2010-10-25 NOTE — Letter (Signed)
Summary: [facility]  [facility]   Imported By: Lanelle Bal 03/10/2010 11:33:25  _____________________________________________________________________  External Attachment:    Type:   Image     Comment:   External Document

## 2010-10-25 NOTE — Letter (Signed)
Summary: [facility]  [facility]   Imported By: Lanelle Bal 05/21/2010 13:16:38  _____________________________________________________________________  External Attachment:    Type:   Image     Comment:   External Document

## 2010-10-25 NOTE — Progress Notes (Signed)
Summary: Lab results  Phone Note Outgoing Call Call back at Home Phone [phone]   Call placed by: Shonna Chock CMA,  May 18, 2010 12:30 PM Call placed to: Patient Summary of Call: Left message with info below and for patient to call to schedule appointment to futher address  Severely uncontrolled Diabetes;A1c 8.8 % has almost 80% increased long term cardiovascular risk. MINIMAL goal = A1c < 7%. Please start new meds& see me as soon as any acute  cardiac issues have been addressed. Levester Fresh CMA  May 18, 2010 12:31 PM n

## 2010-10-31 ENCOUNTER — Encounter: Payer: Self-pay | Admitting: Internal Medicine

## 2010-10-31 NOTE — Assessment & Plan Note (Signed)
Summary: 3 month follow-up on Labs    Vital Signs:  Patient profile:   60 year old male Weight:      171 pounds BMI:     28.56 Pulse rate:   76 / minute Resp:     16 per minute BP sitting:   124 / 60  (left arm) Cuff size:   large  Vitals Entered By: Shonna Chock CMA (October 25, 2010 3:08 PM) CC: 3 month follow-up on lbas (copy given) , Type 2 diabetes mellitus follow-up   Primary Care Provider:  Dr. Dayton Scrape.[facility]  CC:  3 month follow-up on lbas (copy given)  and Type 2 diabetes mellitus follow-up.  History of Present Illness: Type 2 Diabetes Mellitus Follow-Up      This is a 60 year old man who presents for Type 2 diabetes mellitus follow-up.  The patient denies polyuria, polydipsia, blurred vision, self managed hypoglycemia, weight loss, weight gain, and numbness of extremities.  The patient denies the following symptoms: neuropathic pain, chest pain, vomiting, orthostatic symptoms, poor wound healing, intermittent claudication, vision loss, and foot ulcer.  Since the last visit the patient reports good dietary compliance, exercising regularly, and monitoring blood glucose.  The patient has been measuring capillary blood glucose before breakfast, 82-104 and  2 hrs after < 160. A1c was 6.2% ( average sugar 132, risk 24%).  A1c was 8.8% in 04/2010( average sugar 206; risk 76% increased).  Allergies (verified): No Known Drug Allergies  Physical Exam  General:  well-nourished;alert,appropriate and cooperative throughout examination Lungs:  Normal respiratory effort, chest expands symmetrically. Lungs are clear to auscultation, no crackles or wheezes. Heart:  normal rate, regular rhythm, no gallop, no rub, no JVD, and grade 1 /6 systolic murmur.   Pulses:  R and L carotid,radial,dorsalis pedis and posterior tibial pulses are full and equal bilaterally Extremities:  No clubbing, cyanosis, edema. Good nail health.   Neurologic:  alert & oriented X3 and sensation intact  to light touch over feet.   Skin:  Intact without suspicious lesions or rashes Psych:  Focused & intelligent   Impression & Recommendations:  Problem # 1:  DIABETES MELLITUS, CONTROLLED (ICD-250.00) Assessment Unchanged EXCELLENT control His updated medication list for this problem includes:    Altace 5 Mg Caps (Ramipril) .[name]... 1 by mouth once daily    Aspirin Ec 325 Mg Tbec (Aspirin) .[name]... Take one tablet by mouth daily as needed    Metformin Hcl 500 Mg Xr24h-tab (Metformin hcl) .[name]... 1 with largest meal  Complete Medication List: 1)  Niaspan 1000 Mg Cr-tabs (Niacin (antihyperlipidemic)) .[name].. 1 by mouth at bedtime 2)  Altace 5 Mg Caps (Ramipril) .[name].. 1 by mouth once daily 3)  Lipitor 20 Mg Tabs (Atorvastatin calcium) .[name].. 1 by mouth once daily as needed 4)  Aspirin Ec 325 Mg Tbec (Aspirin) .... Take one tablet by mouth daily as needed 5)  Lamictal 200 Mg Tabs (Lamotrigine) .[name].. 1 tab once daily 6)  Effient 10 Mg Tabs (Prasugrel hcl) .[name].. 1 tab once daily 7)  Nitrostat 0.4 Mg Subl (Nitroglycerin) .[name].. 1 tablet under tongue at onset of chest pain; you may repeat every 5 minutes for up to 3 doses. 8)  Propecia 1 Mg Tabs (Finasteride) .[name].. 1 tab once daily 9)  Vitamin D3 5000 Unit Caps (Cholecalciferol) .[name].. 1 by mouth once daily 10)  Onetouch Ultra Test Strp (Glucose blood) .... Check bloodsugar daily,  dx:250.02 11)  Onetouch Lancets Misc (Lancets) .... Check bloodsugar daily  dx: 250.02 12)  Isosorbide Dinitrate 30 Mg Tabs (Isosorbide dinitrate) .... 1/2 by mouth once daily 13)  Bystolic 5 Mg Tabs (Nebivolol hcl) .[name].. 1 by mouth once daily 14)  Coq10 100 Mg Caps (Coenzyme q10) .[name].. 1 by mouth once daily 15)  Metformin Hcl 500 Mg Xr24h-tab (Metformin hcl) .Marland KitchenMarland Kitchen. 1 with largest meal  Patient Instructions: 1)  Please schedule a follow-up appointment in 4 months. 2)  HbgA1C prior to visit, ICD-9:250.00 3)  Urine Microalbumin prior to visit, ICD-9:250.00. 4)  Check your blood sugars  regularly. If your readings are usually above :150  or below 90  fasting OR > 180  two hrs after a meal you should contact our office. 5)  See your eye doctor yearly to check for diabetic eye damage. 6)  Check your feet each night for sore areas, calluses or signs of infection.   Orders Added: 1)  Est. Patient Level III [16109]

## 2010-11-04 ENCOUNTER — Emergency Department (HOSPITAL_COMMUNITY): Payer: 59

## 2010-11-04 ENCOUNTER — Inpatient Hospital Stay (HOSPITAL_COMMUNITY)
Admission: EM | Admit: 2010-11-04 | Discharge: 2010-11-15 | DRG: 234 | Disposition: A | Discharge status: Home / Self Care | Payer: 59 | Attending: Surgery | Admitting: Surgery

## 2010-11-04 DIAGNOSIS — I25810 Atherosclerosis of coronary artery bypass graft(s) without angina pectoris: Secondary | ICD-10-CM | POA: Diagnosis present

## 2010-11-04 DIAGNOSIS — E785 Hyperlipidemia, unspecified: Secondary | ICD-10-CM | POA: Diagnosis present

## 2010-11-04 DIAGNOSIS — E119 Type 2 diabetes mellitus without complications: Secondary | ICD-10-CM | POA: Diagnosis present

## 2010-11-04 DIAGNOSIS — D62 Acute posthemorrhagic anemia: Secondary | ICD-10-CM | POA: Diagnosis not present

## 2010-11-04 DIAGNOSIS — Z7982 Long term (current) use of aspirin: Secondary | ICD-10-CM

## 2010-11-04 DIAGNOSIS — Z9861 Coronary angioplasty status: Secondary | ICD-10-CM

## 2010-11-04 DIAGNOSIS — T82897A Other specified complication of cardiac prosthetic devices, implants and grafts, initial encounter: Principal | ICD-10-CM | POA: Diagnosis present

## 2010-11-04 DIAGNOSIS — Y92009 Unspecified place in unspecified non-institutional (private) residence as the place of occurrence of the external cause: Secondary | ICD-10-CM

## 2010-11-04 DIAGNOSIS — I808 Phlebitis and thrombophlebitis of other sites: Secondary | ICD-10-CM | POA: Diagnosis not present

## 2010-11-04 DIAGNOSIS — Z87891 Personal history of nicotine dependence: Secondary | ICD-10-CM

## 2010-11-04 DIAGNOSIS — I200 Unstable angina: Secondary | ICD-10-CM | POA: Diagnosis present

## 2010-11-04 DIAGNOSIS — E876 Hypokalemia: Secondary | ICD-10-CM | POA: Diagnosis not present

## 2010-11-04 DIAGNOSIS — Y849 Medical procedure, unspecified as the cause of abnormal reaction of the patient, or of later complication, without mention of misadventure at the time of the procedure: Secondary | ICD-10-CM | POA: Diagnosis not present

## 2010-11-04 DIAGNOSIS — I252 Old myocardial infarction: Secondary | ICD-10-CM

## 2010-11-04 DIAGNOSIS — Z7902 Long term (current) use of antithrombotics/antiplatelets: Secondary | ICD-10-CM

## 2010-11-04 DIAGNOSIS — Y840 Cardiac catheterization as the cause of abnormal reaction of the patient, or of later complication, without mention of misadventure at the time of the procedure: Secondary | ICD-10-CM | POA: Diagnosis present

## 2010-11-04 DIAGNOSIS — T82898A Other specified complication of vascular prosthetic devices, implants and grafts, initial encounter: Secondary | ICD-10-CM | POA: Diagnosis not present

## 2010-11-04 LAB — COMPREHENSIVE METABOLIC PANEL
AST: 29 U/L (ref 0–37)
Alkaline Phosphatase: 43 U/L (ref 39–117)
BUN: 22 mg/dL (ref 6–23)
CO2: 25 mEq/L (ref 19–32)
Chloride: 105 mEq/L (ref 96–112)
Creatinine, Ser: 0.91 mg/dL (ref 0.4–1.5)
GFR calc Af Amer: >60 mL/min (ref >60)
GFR calc non Af Amer: >60 mL/min (ref >60)
Glucose, Bld: 94 mg/dL (ref 70–99)
Potassium: 3.9 mEq/L (ref 3.5–5.1)
Sodium: 137 mEq/L (ref 135–145)
Total Bilirubin: 0.3 mg/dL (ref 0.3–1.2)
Total Protein: 6.2 g/dL (ref 6.0–8.3)

## 2010-11-04 LAB — CBC
HCT: 41.3 % (ref 39.0–52.0)
MCH: 30.5 pg (ref 26.0–34.0)
MCV: 86.2 fL (ref 78.0–100.0)
RDW: 12.5 % (ref 11.5–15.5)

## 2010-11-04 LAB — POCT CARDIAC MARKERS
CKMB, poc: <1 ng/mL — ABNORMAL LOW (ref 1.0–8.0)
Myoglobin, poc: 46.5 ng/mL (ref 12–200)
Troponin i, poc: <0.05 ng/mL (ref 0.00–0.09)

## 2010-11-04 LAB — BASIC METABOLIC PANEL
BUN: 24 mg/dL — ABNORMAL HIGH (ref 6–23)
Chloride: 104 mEq/L (ref 96–112)
GFR calc Af Amer: >60 mL/min (ref >60)
Glucose, Bld: 109 mg/dL — ABNORMAL HIGH (ref 70–99)

## 2010-11-04 LAB — URINALYSIS, ROUTINE W REFLEX MICROSCOPIC
Bilirubin Urine: NEGATIVE
Ketones, ur: NEGATIVE mg/dL
Nitrite: NEGATIVE
Urine Glucose, Fasting: NEGATIVE mg/dL
pH: 6 (ref 5.0–8.0)

## 2010-11-04 LAB — DIFFERENTIAL
Basophils Absolute: 0 10*3/uL (ref 0.0–0.1)
Eosinophils Absolute: 0.2 10*3/uL (ref 0.0–0.7)
Eosinophils Relative: 4 % (ref 0–5)
Lymphocytes Relative: 21 % (ref 12–46)
Lymphs Abs: 1.5 10*3/uL (ref 0.7–4.0)
Monocytes Relative: 15 % — ABNORMAL HIGH (ref 3–12)
Neutro Abs: 4.1 10*3/uL (ref 1.7–7.7)
Neutrophils Relative %: 60 % (ref 43–77)

## 2010-11-04 LAB — POCT I-STAT, CHEM 8
Calcium, Ion: 1.15 mmol/L (ref 1.12–1.32)
HCT: 42 % (ref 39.0–52.0)
Hemoglobin: 14.3 g/dL (ref 13.0–17.0)

## 2010-11-04 LAB — APTT: aPTT: 28 seconds (ref 24–37)

## 2010-11-04 LAB — PROTIME-INR: INR: 0.9 (ref 0.00–1.49)

## 2010-11-05 DIAGNOSIS — I2510 Atherosclerotic heart disease of native coronary artery without angina pectoris: Secondary | ICD-10-CM

## 2010-11-05 LAB — GLUCOSE, CAPILLARY
Glucose-Capillary: 87 mg/dL (ref 70–99)
Glucose-Capillary: 70 mg/dL (ref 70–99)

## 2010-11-05 LAB — CARDIAC PANEL(CRET KIN+CKTOT+MB+TROPI)
CK, MB: 1.4 ng/mL (ref 0.3–4.0)
Relative Index: 1.2 (ref 0.0–2.5)
Troponin I: 0.01 ng/mL (ref 0.00–0.06)

## 2010-11-05 LAB — HEPARIN LEVEL (UNFRACTIONATED): Heparin Unfractionated: 0.19 IU/mL — ABNORMAL LOW (ref 0.30–0.70)

## 2010-11-06 DIAGNOSIS — I2510 Atherosclerotic heart disease of native coronary artery without angina pectoris: Secondary | ICD-10-CM

## 2010-11-06 DIAGNOSIS — Z01810 Encounter for preprocedural cardiovascular examination: Secondary | ICD-10-CM

## 2010-11-06 LAB — CBC
HCT: 38.9 % — ABNORMAL LOW (ref 39.0–52.0)
MCH: 29.4 pg (ref 26.0–34.0)
MCHC: 33.9 g/dL (ref 30.0–36.0)
RDW: 12.8 % (ref 11.5–15.5)

## 2010-11-06 LAB — GLUCOSE, CAPILLARY
Glucose-Capillary: 107 mg/dL — ABNORMAL HIGH (ref 70–99)
Glucose-Capillary: 92 mg/dL (ref 70–99)

## 2010-11-06 LAB — SURGICAL PCR SCREEN: MRSA, PCR: NEGATIVE

## 2010-11-06 LAB — POCT ACTIVATED CLOTTING TIME: Activated Clotting Time: 134 seconds

## 2010-11-06 LAB — PLATELET INHIBITION P2Y12

## 2010-11-07 LAB — GLUCOSE, CAPILLARY: Glucose-Capillary: 106 mg/dL — ABNORMAL HIGH (ref 70–99)

## 2010-11-07 LAB — BASIC METABOLIC PANEL
CO2: 27 mEq/L (ref 19–32)
Chloride: 105 mEq/L (ref 96–112)
GFR calc Af Amer: >60 mL/min (ref >60)
Potassium: 4 mEq/L (ref 3.5–5.1)
Sodium: 139 mEq/L (ref 135–145)

## 2010-11-07 LAB — CBC
HCT: 38.1 % — ABNORMAL LOW (ref 39.0–52.0)
Hemoglobin: 13.1 g/dL (ref 13.0–17.0)
MCH: 29.9 pg (ref 26.0–34.0)
MCV: 87 fL (ref 78.0–100.0)
RBC: 4.38 MIL/uL (ref 4.22–5.81)

## 2010-11-07 NOTE — Procedures (Signed)
NAME:  QUILLAN, WHITTER NO.:  [id number]  MEDICAL RECORD NO.:  [id number]           PATIENT TYPE:  I  LOCATION:  2034                         FACILITY:  [facility]  PHYSICIAN:  Italy Hilty, MD         DATE OF BIRTH:  [birth year]  DATE OF PROCEDURE:  11/05/2010 DATE OF DISCHARGE:                           CARDIAC CATHETERIZATION   This is a left heart catheterization.  INDICATIONS:  Chest pain.  OPERATOR:  Italy Hilty, MD  BACKGROUND INFORMATION:  Mr. Davee is a 60 year old male status post bypass surgery with a LIMA to LAD and saphenous vein graft to the OM which is known occluded.  He also has a history of multiple stents to the mid to distal RCA with in-stent restenosis twice and is status post recent brachytherapy in the last few months.  He is now referred for chest pain which is typical.  PROCEDURE:  The patient was brought to the cardiac catheterization, sterilely prepped and draped in a normal fashion.  The area around the left radial artery as well as the right femoral artery was identified and after procedural time-out, an attempt was made to access the left radial artery unsuccessfully.  The attention was then turned to the right femoral artery which was easily accessed and after placement of 5- French sheath, sequential selective coronary angiography was performed with a 5-French JL-4 and JR-4 catheters and internal mammary catheter was used to access the left internal mammary.  A pigtail catheter was used to out measure LV pressure as well as perform an LV gram.  Next, there were no acute complications.  Estimated blood loss less than 10 mL.  The patient was given 2 mg of Versed and 50 mcg of fentanyl for sedation and approximately 5 mL of 1% lidocaine was used for local anesthesia.  FINDINGS: 1. Left main - no disease. 2. LAD - occluded proximally and bypass distally with a LIMA to LAD.     The LIMA to LAD was patent with good distal runoff. 3. The  ramus - patent with 40% proximal disease. 4. Left circumflex - mild disease. 5. RCA - dominant.  A 99% in-stent restenosis in the midvessel was     noted. 6. LVEDP equals 19 mmHg. 7. LV gram -  greater than 55%, no wall motion abnormalities.  No     mitral regurgitation.  IMPRESSION: 1. In-stent restenosis of 99% the mid right coronary artery. 2. Mild-to-moderate proximal ramus disease. 3. Patent left internal mammary artery to left anterior descending,     with good distal runoff. 4. Known occlusion of the saphenous vein graft to obtuse marginal-1,     not engaged.  PLAN:  Given the fact that Mr. Demartin had number of failed stents and brachytherapy to the right coronary artery with repeat in-stent restenosis, I would at this point consider single-vessel bypass as further attempts at stenting would be unlikely to be successful.     Italy Hilty, MD     CH/MEDQ  D:  11/05/2010  T:  11/06/2010  Job:  [id number]  cc:  Nicki Guadalajara, M.D.  Electronically Signed by Kirtland Bouchard. HILTY M.D. on 11/07/2010 10:12:05 AM

## 2010-11-08 LAB — BASIC METABOLIC PANEL
CO2: 28 mEq/L (ref 19–32)
Calcium: 8.8 mg/dL (ref 8.4–10.5)
Creatinine, Ser: 1.03 mg/dL (ref 0.4–1.5)
GFR calc Af Amer: >60 mL/min (ref >60)
GFR calc non Af Amer: >60 mL/min (ref >60)
Glucose, Bld: 89 mg/dL (ref 70–99)
Sodium: 140 mEq/L (ref 135–145)

## 2010-11-08 LAB — GLUCOSE, CAPILLARY
Glucose-Capillary: 88 mg/dL (ref 70–99)
Glucose-Capillary: 99 mg/dL (ref 70–99)

## 2010-11-08 LAB — CBC
MCH: 29.1 pg (ref 26.0–34.0)
MCHC: 33.7 g/dL (ref 30.0–36.0)
RDW: 12.8 % (ref 11.5–15.5)

## 2010-11-09 ENCOUNTER — Telehealth: Payer: Self-pay | Admitting: Internal Medicine

## 2010-11-09 LAB — GLUCOSE, CAPILLARY
Glucose-Capillary: 108 mg/dL — ABNORMAL HIGH (ref 70–99)
Glucose-Capillary: 136 mg/dL — ABNORMAL HIGH (ref 70–99)

## 2010-11-09 LAB — CBC
HCT: 41.5 % (ref 39.0–52.0)
Hemoglobin: 14.2 g/dL (ref 13.0–17.0)
MCV: 87 fL (ref 78.0–100.0)
Platelets: 154 10*3/uL (ref 150–400)
RBC: 4.77 MIL/uL (ref 4.22–5.81)
WBC: 6.3 10*3/uL (ref 4.0–10.5)

## 2010-11-09 NOTE — H&P (Signed)
NAME:  TRICE, ASPINALL NO.:  [id number]  MEDICAL RECORD NO.:  [id number]           PATIENT TYPE:  E  LOCATION:  [facility]                         FACILITY:  [facility]  PHYSICIAN:  Nicki Guadalajara, M.D.     DATE OF BIRTH:  [birth year]  DATE OF ADMISSION:  11/04/2010 DATE OF DISCHARGE:                             HISTORY & PHYSICAL   CHIEF COMPLAINTS:  Chest pain.  HISTORY OF PRESENT ILLNESS:  Mr. Ditommaso is a 60 year old male, known to Dr. Tresa Endo with a history of coronary artery disease.  He had bypass grafting in 1995 with LIMA to the LAD and SVG to the OM-2.  He has had multiple interventions to the native RCA.  In 1999, he had an RCA stent. In August 2010, he had an inferior MI with in-stent restenosis and the RCA was stented in three sites.  In March 2011, he had in-stent restenosis to the RCA which was treated with intervention.  In June 2011, he again had in-stent restenosis and was treated medically.  He was studied again in September 2011 and had an occluded the vein graft to the OM-2 and in-stent restenosis again to the RCA which was treated with cutting balloon.  He was referred to [facility] for brachytherapy which was done in October 2011.  He has done quite well since then.  He saw Dr. Tresa Endo, in the office on October 31, 2010.  He has not been having any chest pain.  Dr. Tresa Endo, did see T-wave inversion in lead III which was new compared to previous EKGs.  He had set him up to have a Myoview.  Today he was walking around outside at home when he developed chest pain which was difficult to his anginal symptoms.  He describes it burning across the top of his chest.  He had no associated nausea, vomiting, or diaphoresis.  There was no radiation to his arms or jaw.  He took two aspirin and two nitroglycerin and came to the emergency room.  By the time he got to the emergency room he was just pain free.  His initial troponin is negative.  His EKG shows no  acute changes.  He is admitted now for further evaluation.  PAST MEDICAL HISTORY:  Remarkable for type 2 non-insulin-dependent diabetes, Dr. Tresa Endo notes in his office visit from October 31, 2010, that his hemoglobin A1c is now down to 6.2.  The patient has treated dyslipidemia.  He has had normal LV function in the past.  HOME MEDICATIONS: 1. Lipitor 20 mg a day. 2. Aspirin 325 mg a day. 3. CoQ10 one tablet twice a day. 4. Cod liver oil twice a day. 5. Seroquel 50 mg daily. 6. Effient 10 mg daily. 7. Vitamin D. 8. Nitroglycerin sublingual p.r.n. 9. Imdur 15 mg a day. 10.Bystolic 5 mg 1/2 tablet at that time. 11.Finasteride 1 mg daily. 12.Niaspan 1 g nightly.  ALLERGIES:  He has no known drug allergies.  SOCIAL HISTORY:  He is divorced, but was remarried in October 2010.  He has three children who are all grown.  He has no  grandchildren.  He quit smoking in 2005.  He works in Airline pilot.  FAMILY HISTORY:  Unremarkable.  REVIEW OF SYSTEMS:  Essentially unremarkable except for above.  PHYSICAL EXAMINATION:  VITAL SIGNS:  Blood pressure 144/73, pulse 70, and temperature 92. GENERAL:  He is a well-developed, well-nourished male in no acute distress. HEENT:  Normocephalic and atraumatic.  Extraocular movements are intact. Sclera is anicteric.  Lids and conjunctivae within normal limits. NECK:  Without bruit or JVD. CHEST:  Clear to auscultation and percussion. CARDIAC:  Regular rate and rhythm with normal S1 and S2. ABDOMEN:  Nontender.  No hepatosplenomegaly. EXTREMITIES:  Without edema.  Distal pulses are 2+/4.  There are no femoral bruits noted. NEURO:  Grossly intact.  He is awake, alert, oriented, cooperative. Moves all extremities without obvious deficit. SKIN:  Cool and dry.  His EKG shows sinus rhythm, he has a upright T-waves in lead III at this time.  There is no other acute changes.  Chest x-ray shows no active disease.  Sodium 138, potassium 4.0, BUN 24, creatinine  1.03.  White count 6.9, hemoglobin 14.6, hematocrit 41.3, platelets 167, INR 0.9, and troponin is negative.  IMPRESSION: 1. Unstable angina. 2. Known coronary artery disease with coronary artery bypass grafting     in 2005 with multiple interventions to the native RCA with the most     recent procedure being brachytherapy at [facility] in     October 2011. 3. Recent the EKG changes with a transient T-wave inversion in lead     III. 4. Type 2 non-insulin dependent diabetes. 5. Untreated dyslipidemia. 6. History of smoking, quit in 2005.  PLAN:  The patient will be put on heparin.  We will go ahead and start him on nitro paste as well.  He is currently pain free.  He will most likely need restudy, this will be decided in the morning.     Abelino Derrick, P.A.   ______________________________ Nicki Guadalajara, M.D.    Lenard Lance  D:  11/04/2010  T:  11/04/2010  Job:  [id number]  cc:   Titus Dubin. Alwyn Ren, MD,FACP,FCCP  Electronically Signed by Corine Shelter P.A. on 11/05/2010 01:41:52 PM Electronically Signed by Nicki Guadalajara M.D. on 11/09/2010 11:28:59 AM

## 2010-11-10 LAB — CBC
Hemoglobin: 13.9 g/dL (ref 13.0–17.0)
MCH: 29.5 pg (ref 26.0–34.0)
MCHC: 34.2 g/dL (ref 30.0–36.0)
RDW: 12.6 % (ref 11.5–15.5)

## 2010-11-10 LAB — GLUCOSE, CAPILLARY
Glucose-Capillary: 107 mg/dL — ABNORMAL HIGH (ref 70–99)
Glucose-Capillary: 105 mg/dL — ABNORMAL HIGH (ref 70–99)

## 2010-11-11 ENCOUNTER — Observation Stay (HOSPITAL_COMMUNITY): Payer: 59

## 2010-11-11 LAB — BLOOD GAS, ARTERIAL
Drawn by: 129801
pCO2 arterial: 42.1 mmHg (ref 35.0–45.0)
pH, Arterial: 7.4 (ref 7.350–7.450)

## 2010-11-11 LAB — GLUCOSE, CAPILLARY
Glucose-Capillary: 139 mg/dL — ABNORMAL HIGH (ref 70–99)
Glucose-Capillary: 124 mg/dL — ABNORMAL HIGH (ref 70–99)

## 2010-11-11 LAB — COMPREHENSIVE METABOLIC PANEL
BUN: 12 mg/dL (ref 6–23)
CO2: 31 mEq/L (ref 19–32)
Chloride: 101 mEq/L (ref 96–112)
Creatinine, Ser: 0.98 mg/dL (ref 0.4–1.5)
GFR calc non Af Amer: >60 mL/min (ref >60)
Total Bilirubin: 0.4 mg/dL (ref 0.3–1.2)

## 2010-11-11 LAB — URINALYSIS, ROUTINE W REFLEX MICROSCOPIC
Ketones, ur: NEGATIVE mg/dL
Urine Glucose, Fasting: NEGATIVE mg/dL
pH: 5.5 (ref 5.0–8.0)

## 2010-11-11 LAB — CBC
HCT: 39.1 % (ref 39.0–52.0)
Platelets: 157 10*3/uL (ref 150–400)
RDW: 12.7 % (ref 11.5–15.5)
WBC: 6.9 10*3/uL (ref 4.0–10.5)

## 2010-11-11 LAB — APTT: aPTT: 32 seconds (ref 24–37)

## 2010-11-12 ENCOUNTER — Inpatient Hospital Stay (HOSPITAL_COMMUNITY): Payer: 59

## 2010-11-12 DIAGNOSIS — I2510 Atherosclerotic heart disease of native coronary artery without angina pectoris: Secondary | ICD-10-CM

## 2010-11-12 LAB — CBC
HCT: 38.5 % — ABNORMAL LOW (ref 39.0–52.0)
Hemoglobin: 10.8 g/dL — ABNORMAL LOW (ref 13.0–17.0)
MCH: 29.3 pg (ref 26.0–34.0)
MCH: 29.6 pg (ref 26.0–34.0)
MCV: 86.6 fL (ref 78.0–100.0)
Platelets: 143 10*3/uL — ABNORMAL LOW (ref 150–400)
Platelets: 111 10*3/uL — ABNORMAL LOW (ref 150–400)
RBC: 3.35 MIL/uL — ABNORMAL LOW (ref 4.22–5.81)
RBC: 3.65 MIL/uL — ABNORMAL LOW (ref 4.22–5.81)
RDW: 12.6 % (ref 11.5–15.5)
WBC: 7 10*3/uL (ref 4.0–10.5)
WBC: 5.9 10*3/uL (ref 4.0–10.5)

## 2010-11-12 LAB — POCT I-STAT 4, (NA,K, GLUC, HGB,HCT)
Glucose, Bld: 103 mg/dL — ABNORMAL HIGH (ref 70–99)
HCT: 32 % — ABNORMAL LOW (ref 39.0–52.0)
HCT: 38 % — ABNORMAL LOW (ref 39.0–52.0)
Hemoglobin: 10.9 g/dL — ABNORMAL LOW (ref 13.0–17.0)
Hemoglobin: 12.9 g/dL — ABNORMAL LOW (ref 13.0–17.0)
Potassium: 3.8 mEq/L (ref 3.5–5.1)
Potassium: 3.9 mEq/L (ref 3.5–5.1)
Sodium: 141 mEq/L (ref 135–145)
Sodium: 140 mEq/L (ref 135–145)
Sodium: 142 mEq/L (ref 135–145)

## 2010-11-12 LAB — POCT I-STAT 3, ART BLOOD GAS (G3+)
Bicarbonate: 23.8 mEq/L (ref 20.0–24.0)
O2 Saturation: 98 %
Patient temperature: 37.6
TCO2: 25 mmol/L (ref 0–100)
TCO2: 26 mmol/L (ref 0–100)
pCO2 arterial: 29.9 mmHg — ABNORMAL LOW (ref 35.0–45.0)
pH, Arterial: 7.316 — ABNORMAL LOW (ref 7.350–7.450)
pO2, Arterial: 90 mmHg (ref 80.0–100.0)

## 2010-11-12 LAB — GLUCOSE, CAPILLARY
Glucose-Capillary: 107 mg/dL — ABNORMAL HIGH (ref 70–99)
Glucose-Capillary: 103 mg/dL — ABNORMAL HIGH (ref 70–99)
Glucose-Capillary: 95 mg/dL (ref 70–99)

## 2010-11-12 LAB — POCT I-STAT, CHEM 8
BUN: 12 mg/dL (ref 6–23)
Creatinine, Ser: 1 mg/dL (ref 0.4–1.5)
Potassium: 4.1 mEq/L (ref 3.5–5.1)
Sodium: 142 mEq/L (ref 135–145)
TCO2: 23 mmol/L (ref 0–100)

## 2010-11-12 LAB — MAGNESIUM: Magnesium: 1.5 mg/dL (ref 1.5–2.5)

## 2010-11-12 LAB — PROTIME-INR
INR: 1.22 (ref 0.00–1.49)
Prothrombin Time: 15.6 seconds — ABNORMAL HIGH (ref 11.6–15.2)

## 2010-11-12 LAB — APTT: aPTT: 39 seconds — ABNORMAL HIGH (ref 24–37)

## 2010-11-12 NOTE — Consult Note (Signed)
NAME:  Seth Sanchez, Seth Sanchez NO.:  [id number]  MEDICAL RECORD NO.:  [id number]           PATIENT TYPE:  I  LOCATION:  2034                         FACILITY:  [facility]  PHYSICIAN:  Evelene Croon, M.D.     DATE OF BIRTH:  [birth year]  DATE OF CONSULTATION:  11/05/2010 DATE OF DISCHARGE:                                CONSULTATION   REFERRING PHYSICIAN:  Italy Hilty, MD  REASON FOR CONSULTATION:  Severe multivessel coronary disease with in- stent restenosis of the right coronary artery status post coronary bypass graft surgery in 1995.  CLINICAL HISTORY:  I was asked by Dr. Rennis Golden to evaluate Seth Sanchez for consideration of redo coronary artery bypass graft surgery.  He is a 60- year-old gentleman who has a long history of coronary disease and underwent coronary bypass graft surgery x2 by me in 1995 with a left internal mammary graft to the LAD and saphenous vein graft to the second obtuse marginal branch.  He subsequently had a stent placed in his rightcoronary artery in 1999.  In August 2010, he had an inferior MI with in- stent restenosis or occlusion and had 3 more stents placed in the right coronary artery.  In March 2011, he had in-stent restenosis requiring intervention.  In June 2011, he again had restenosis in the right coronary artery.  In September 2011, he had an occluded vein graft to the second obtuse marginal as well as high-grade in-stent restenosis in the right coronary artery that was treated with cutting balloon.  He was referred to [facility] for brachytherapy which was done in October 2011.  He said that he has done relatively well since then and saw Dr. Tresa Endo in the office in early February for routine followup.  He was not having any symptoms at that time but did have new EKG changes noted with T-wave inversion in lead III.  He was to be set up for a Myoview examination, but subsequently developed new-onset exertional chest discomfort that  he described as a burning across the top of his chest.  He took 2 aspirin and 2 nitroglycerin and came to the emergency room.  He has been pain free.  He was pain free at time of presentation in the emergency room.  His troponin's were negative.  Electrocardiogram showed no acute changes and it ruled out for myocardial infarction.  He subsequently had mild chest discomfort while walking around the bathroom this morning.  He underwent cardiac catheterization today by Dr. Rennis Golden. This showed high-grade in-stent restenosis in the right coronary artery with 99% stenosis within the midportion of the vessel.  Beyond the stents, the vessel was widely patent giving off posterior descending and posterolateral branches.  The LAD was occluded proximally with a patent left internal mammary graft to the mid LAD.  The distal vessel had noted significant disease in it.  The left circumflex gave off a moderate- sized intermediate that had segmental 40% proximal narrowing.  The remainder of the left circumflex had no significant disease.  The old vein graft to the second obtuse marginal was occluded.  Left  ventricular ejection fraction was greater than 55% with no wall motion abnormalities and no mitral regurgitation.  Left ventricular end-diastolic pressure was measured at 19.  REVIEW OF SYSTEMS:  GENERAL:  He denies any fever or chills.  He has had no recent weight changes.  He denies any fatigue.  EYES:  Negative. ENT:  Negative.  ENDOCRINE:  He does have type 2 diabetes which was diagnosed in the last couple years.  He has been managing this with diet and has his hemoglobin A1c down to about 6.2.  He has no history of hypothyroidism.  CARDIOVASCULAR:  As above.  He denies any exertional dyspnea.  He has had no PND or orthopnea.  Denies any peripheral edema or palpitations.  RESPIRATORY:  Denies cough or sputum production.  GI: He has had no nausea or vomiting.  Denies melena or bright red blood  per rectum.  GU:  He denies dysuria and hematuria.  MUSCULOSKELETAL:  Denies arthralgias and myalgias.  NEUROLOGICAL:  He denies any focal weakness or numbness.  He did have some transient visual changes in his right eye today after his catheterization which he described as Schreger lines which resolved.  He does feel he had some memory disturbance after his initial coronary bypass surgery.  Denies any history of TIA or stroke. PSYCHIATRIC:  Negative.  HEMATOLOGICAL:  He does bruise easily.  ALLERGIES:  None.  MEDICATIONS AT HOME:  Lipitor 20 mg daily, aspirin 325 mg daily, Coenzyme Q10 1 tablet b.i.d., cod liver oil twice a day, Seroquel 50 mg daily, Effient 10 mg daily, vitamin D daily, sublingual nitroglycerin p.r.n., Imdur 15 mg daily, Bystolic 2.5 mg daily, finasteride 1 mg daily, and Niaspan 1 gram nightly.  PAST MEDICAL HISTORY:  Significant for coronary disease as mentioned above.  History of dyslipidemia as well as new onset type 2 diabetes.  SOCIAL HISTORY:  He is divorced and remarried.  He is a previous smoker, but quit in 2005 and works in Air[employer].  FAMILY HISTORY:  Negative.  PHYSICAL EXAMINATION:  GENERAL:  He is a well-developed white male in no distress. VITAL SIGNS:  Blood pressure is 125/65, pulse is 70 and regular. Respiratory rate is 16 and unlabored. HEENT:  Shows to be normocephalic and atraumatic.  Pupils are equal and reactive to light and accommodation.  Extraocular muscles are intact. Throat is clear. NECK:  Shows normal carotid pulses bilaterally.  No bruits.  No adenopathy or thyromegaly. CARDIAC:  Shows regular rate and rhythm with normal S1 and S2.  He has no murmur, rub, or gallop. LUNGS:  Clear.  There is a well-healed sternotomy incision. ABDOMEN:  Shows active bowel sounds.  Abdomen is soft, flat, and nontender.  No palpable masses or organomegaly. EXTREMITIES:  Shows no peripheral edema.  Pedal pulses are palpable bilaterally. SKIN:  Warm and  dry.  There is a old saphenous vein harvest incision in the right lower leg.  He does have a bandage over his left radial artery which he said was __________ today for catheterization, although the catheterization was reportedly done through the femoral artery.  The right radial pulse is palpable.  He is left-handed. NEUROLOGIC:  Shows to be alert and oriented x3.  Motor and sensory exams are grossly normal.  IMPRESSION:  Seth Sanchez has high-grade in-stent restenosis within the mid right coronary artery having undergone multiple previous interventions on this vessel approximately at 52-month intervals over the past year. He has no significant disease in the left circumflex territory and a  patent left internal mammary graft to the left anterior descending.  I agree that his best long-term option is going to be redo coronary bypass graft surgery to the right coronary territory.  I would consider using a right radial artery graft or possibly a right internal mammary graft since he is only 58 years older than.  I am not sure if either of his grafts would be long enough to reach to the distal right coronary artery or posterior descending branch without extending them with a short piece of vein proximally.  We will get upper extremity arterial Dopplers to the side if the radial artery could be used.  Unfortunately, he has been on Effient since 2010 and does report very easy bruising.  The current recommendation is that this drug be stopped for at least 7 days prior to surgery to decrease the risk of significant bleeding intraoperatively or postoperatively.  We will check a P2Y12 platelet inhibition study as a baseline and we will stop his Effient and aspirin and start Integrilin. I discussed all this with the patient and his wife.  We discussed the benefits and risks of surgery including but not limited to bleeding, blood transfusion, infection, stroke, myocardial infarction, injury to the left  internal mammary graft, graft failure, and death.  They understand and would like to proceed with surgery as soon as we feel that is safe.     Evelene Croon, M.D.     BB/MEDQ  D:  11/05/2010  T:  11/06/2010  Job:  [id number]  cc:   Nicki Guadalajara, M.D. Titus Dubin. Alwyn Ren, MD,FACP,FCCP  Electronically Signed by Evelene Croon M.D. on 11/12/2010 01:32:41 PM

## 2010-11-13 ENCOUNTER — Inpatient Hospital Stay (HOSPITAL_COMMUNITY): Payer: 59

## 2010-11-13 LAB — CREATININE, SERUM
Creatinine, Ser: 1.03 mg/dL (ref 0.4–1.5)
GFR calc Af Amer: >60 mL/min (ref >60)
GFR calc non Af Amer: >60 mL/min (ref >60)

## 2010-11-13 LAB — CBC
HCT: 31.8 % — ABNORMAL LOW (ref 39.0–52.0)
Hemoglobin: 10.8 g/dL — ABNORMAL LOW (ref 13.0–17.0)
Hemoglobin: 12.2 g/dL — ABNORMAL LOW (ref 13.0–17.0)
MCHC: 33.7 g/dL (ref 30.0–36.0)
RBC: 3.65 MIL/uL — ABNORMAL LOW (ref 4.22–5.81)
RBC: 4.08 MIL/uL — ABNORMAL LOW (ref 4.22–5.81)
WBC: 10.3 10*3/uL (ref 4.0–10.5)
WBC: 10.2 10*3/uL (ref 4.0–10.5)

## 2010-11-13 LAB — GLUCOSE, CAPILLARY
Glucose-Capillary: 115 mg/dL — ABNORMAL HIGH (ref 70–99)
Glucose-Capillary: 119 mg/dL — ABNORMAL HIGH (ref 70–99)
Glucose-Capillary: 135 mg/dL — ABNORMAL HIGH (ref 70–99)
Glucose-Capillary: 91 mg/dL (ref 70–99)
Glucose-Capillary: 93 mg/dL (ref 70–99)

## 2010-11-13 LAB — BASIC METABOLIC PANEL
CO2: 26 mEq/L (ref 19–32)
Calcium: 7.9 mg/dL — ABNORMAL LOW (ref 8.4–10.5)
Chloride: 107 mEq/L (ref 96–112)
GFR calc Af Amer: >60 mL/min (ref >60)
Glucose, Bld: 100 mg/dL — ABNORMAL HIGH (ref 70–99)
Potassium: 3.5 mEq/L (ref 3.5–5.1)
Sodium: 138 mEq/L (ref 135–145)

## 2010-11-14 ENCOUNTER — Inpatient Hospital Stay (HOSPITAL_COMMUNITY): Payer: 59

## 2010-11-14 LAB — CROSSMATCH
ABO/RH(D): A NEG
Antibody Screen: NEGATIVE
Unit division: 0

## 2010-11-14 LAB — GLUCOSE, CAPILLARY
Glucose-Capillary: 127 mg/dL — ABNORMAL HIGH (ref 70–99)
Glucose-Capillary: 133 mg/dL — ABNORMAL HIGH (ref 70–99)
Glucose-Capillary: 111 mg/dL — ABNORMAL HIGH (ref 70–99)

## 2010-11-14 LAB — BASIC METABOLIC PANEL
BUN: 12 mg/dL (ref 6–23)
Calcium: 8.4 mg/dL (ref 8.4–10.5)
Chloride: 103 mEq/L (ref 96–112)
Creatinine, Ser: 1.1 mg/dL (ref 0.4–1.5)
GFR calc Af Amer: >60 mL/min (ref >60)
GFR calc non Af Amer: >60 mL/min (ref >60)

## 2010-11-14 LAB — CBC
HCT: 34.4 % — ABNORMAL LOW (ref 39.0–52.0)
MCHC: 32.8 g/dL (ref 30.0–36.0)
Platelets: 134 10*3/uL — ABNORMAL LOW (ref 150–400)
RBC: 3.91 MIL/uL — ABNORMAL LOW (ref 4.22–5.81)
WBC: 10.6 10*3/uL — ABNORMAL HIGH (ref 4.0–10.5)

## 2010-11-14 NOTE — Progress Notes (Signed)
Summary: call dr back- hopp  Phone Note From Other Clinic Call back at [phone] ext [phone]   Summary of Call: Dr.Susan Delaney called and would like to discuss pt with you. She is seeing pt's until 11:45. Available after that time.  Initial call taken by: Army Fossa CMA,  November 09, 2010 10:00 AM  Follow-up for Phone Call        I called Ext [phone] & [phone] & got recordings; I left our back line Follow-up by: Marga Melnick MD,  November 09, 2010 2:54 PM  Additional Follow-up for Phone Call Additional follow up Details #1::        Note: Hop spoke with this MD himself, earlier today. Lucious Groves CMA  November 09, 2010 3:41 PM     Additional Follow-up for Phone Call Additional follow up Details #2::    she will send me brochures re her practice Follow-up by: Marga Melnick MD,  November 09, 2010 5:05 PM

## 2010-11-15 LAB — CBC
HCT: 31.6 % — ABNORMAL LOW (ref 39.0–52.0)
Hemoglobin: 10.6 g/dL — ABNORMAL LOW (ref 13.0–17.0)
MCH: 29.5 pg (ref 26.0–34.0)
MCHC: 33.5 g/dL (ref 30.0–36.0)
MCV: 88 fL (ref 78.0–100.0)
RDW: 12.6 % (ref 11.5–15.5)

## 2010-11-16 NOTE — Op Note (Signed)
NAME:  Seth Sanchez, Seth Sanchez NO.:  [id number]  MEDICAL RECORD NO.:  [id number]           PATIENT TYPE:  LOCATION:                                 FACILITY:  PHYSICIAN:  Evelene Croon, M.D.     DATE OF BIRTH:  04-08-1951  DATE OF PROCEDURE: DATE OF DISCHARGE:                              OPERATIVE REPORT   PREOPERATIVE DIAGNOSIS:  High-grade in-stent restenosis of the right coronary artery, status post coronary artery bypass graft surgery x2 in 1995 and multiple percutaneous interventions to the right coronary artery.  POSTOPERATIVE DIAGNOSIS:  High-grade in-stent restenosis of the right coronary artery, status post coronary artery bypass graft surgery x2 in 1995 and multiple percutaneous interventions to the right coronary artery.  OPERATIVE PROCEDURE:  Redo median sternotomy, redo off-pump coronary artery bypass graft surgery x1 using a saphenous vein graft to the distal right coronary artery.  Endoscopic vein harvesting from the left leg.  ATTENDING SURGEON:  Evelene Croon, MD  ASSISTANT:  Salvatore Decent. Dorris Fetch, MD  SECOND ASSISTANT:  Stephanie Acre. Dasovich, PA  ANESTHESIA:  General endotracheal.  CLINICAL HISTORY:  This patient is a 60 year old gentleman who underwent coronary artery bypass graft surgery x2 by me in 1995 with a left internal mammary graft to the LAD and a saphenous vein graft to the second obtuse marginal branch.  He subsequently had a stent placed in his right coronary artery in 1999 and since has had multiple percutaneous interventions on the right coronary artery with placement of further stents as well as cutting balloon angioplasty and eventually brachytherapy in October 2011.  He now presents with recurrent anginal symptoms and the T-wave inversions in lead III.  Repeat cardiac catheterization shows high-grade mid right coronary stenosis within the previous stented segment.  His left internal mammary graft to the LAD was widely  patent.  The left circumflex had no significant stenosis. Left ventricular function was well preserved.  After review of the catheterization and examination of the patient, it was felt that redo coronary bypass graft surgery to the right coronary artery was the best treatment.  Unfortunately, the patient had been on Effient and therefore we had to wait 7 days for this to resolve so that he had adequate platelet function for surgery.  He was maintained on Integrilin throughout this time and remained asymptomatic.  I discussed the operative procedure of redo coronary artery bypass graft surgery with the patient and his wife.  We checked his upper extremity arterial Doppler examination and his right arm was radial dependent so we could not use that vessel.  His left radial artery had been used for catheterization and it was difficult in getting a wire up through the radial artery and therefore the catheterization had to be completed through the femoral approach.  Therefore, I did not feel the left radial artery would be suitable to use.  I also considered using his right internal mammary artery, but he was relatively short in stature with a short sternum and I did not feel that his right mammary artery would be long enough to reach into the distal  right coronary artery.  I did not feel there would be significant benefit in using this as a free graft. I discussed the benefits and risks of surgery with the patient and his wife including alternatives, benefits, and risks including but not limited to bleeding, blood transfusion, infection, stroke, myocardial infarction, graft failure, and death.  He understood all this and agreed to proceed.  OPERATIVE PROCEDURE:  The patient was taken to the operating room and placed on the table in supine position.  After induction of general endotracheal anesthesia, a Foley catheter was placed in bladder using sterile technique.  Then, the chest, abdomen,  and both lower extremities were prepped and draped in usual sterile manner.  The chest was entered through the previous median sternotomy incision.  The sternal wires were removed.  The sternum was opened using oscillating saw without difficulty.  The bone hooks were used to gently retract the sternum and the mediastinal structures were separated from the backside of the sternum using electrocautery without difficulty.  While this was being performed, a segment of greater saphenous vein was harvested from the left thigh using endoscopic vein harvest technique.  This vein was of medium size and good quality.  Then, the chest retractor was placed.  Dissection was performed to expose the right coronary artery just beyond the acute margin of the heart.  The right coronary artery was traced distally to the takeoff of the posterior descending artery.  Just proximal to the takeoff of the posterior descending artery, the vessel was soft with minimal disease and was suitable for grafting.  Exposure was adequate to perform this off-pump.  Therefore, the off-pump retractor was placed and the patient was given 12,000 units of heparin.  Vessel loops were placed proximally and distally to the planned anastomotic site.  The off-pump retractor was put in position and tightened.  There was excellent exposure.  The patient remained hemodynamically stable throughout this.  The distal right coronary artery was opened.  The tapes were tightened.  There was minimal residual blood flow in the distal right coronary artery.  The internal diameter was about 2 mm.  Then, the segment of greater saphenous vein was anastomosed to the distal right coronary artery in an end-to-side manner using continuous 7-0 Prolene suture.  The vessel loops were removed and the anastomosis checked and had excellent flow. The off-pump retractor was placed.  The graft was then cut to the appropriate length.  The ascending aorta was  exposed without difficulty. A site was chosen for the proximal anastomosis on the right lateral surface of the mid ascending aorta.  Then, with blood pressure around 100 systolic a small opening in the aorta was made using the Heartstring punch.  Then, the 3.8-mm Heartstring device was placed through this opening into the mid ascending aorta and deployed.  There was good hemostasis.  Then, the proximal end of the vein was anastomosed to the aorta in an end-to-side manner using continuous 6-0 Prolene suture.  The Heartstring device was then removed and the suture tied.  The anastomotic sites were hemostatic and lay of the grafts was satisfactory.  Graft marker was placed around the proximal anastomosis. Two temporary right ventricular wires were placed and brought out through the skin.  The patient was given protamine to reverse the heparin.  There was excellent hemostasis.  Then, a single 36-French straight tube was brought through a separate stab incision and positioned in the anterior mediastinum.  Sternum was then reapproximated with double #6 stainless  steel wires.  Fascia was closed with continuous #1 Vicryl suture, subcutaneous tissue was closed with continuous 2-0 Vicryl, and the skin with a 3-0 Vicryl subcuticular closure.  The lowerextremity vein harvest site had been closed in layers in a similar manner.  Sponge, needle, and instrument counts were correct according to the scrub nurse.  The patient remained hemodynamically stable and was transported to the surgical intensive care unit in guarded but stable condition.     Evelene Croon, M.D.     BB/MEDQ  D:  11/12/2010  T:  11/13/2010  Job:  [id number]  cc:   [facility].  Electronically Signed by Evelene Croon M.D. on 11/16/2010 03:32:32 PM

## 2010-11-29 NOTE — Letter (Signed)
Summary: [facility]  [facility]   Imported By: Lanelle Bal 11/21/2010 09:16:24  _____________________________________________________________________  External Attachment:    Type:   Image     Comment:   External Document

## 2010-12-03 ENCOUNTER — Other Ambulatory Visit: Payer: Self-pay | Admitting: Surgery

## 2010-12-03 DIAGNOSIS — I2510 Atherosclerotic heart disease of native coronary artery without angina pectoris: Secondary | ICD-10-CM

## 2010-12-04 ENCOUNTER — Encounter (INDEPENDENT_AMBULATORY_CARE_PROVIDER_SITE_OTHER): Payer: Self-pay | Admitting: Surgery

## 2010-12-04 ENCOUNTER — Ambulatory Visit
Admission: RE | Admit: 2010-12-04 | Discharge: 2010-12-04 | Disposition: A | Discharge status: Home / Self Care | Payer: 59 | Source: Ambulatory Visit | Attending: Surgery | Admitting: Surgery

## 2010-12-04 DIAGNOSIS — I2510 Atherosclerotic heart disease of native coronary artery without angina pectoris: Secondary | ICD-10-CM

## 2010-12-05 NOTE — Assessment & Plan Note (Signed)
OFFICE VISIT  Seth Sanchez, Seth Sanchez DOB:  Mar 03, 1951                                        December 04, 2010 CHART #:  [id number]  The patient returns to my office today for followup status post coronary artery bypass graft surgery on November 12, 2010.  He had a redo sternotomy with off-pump bypass x1, he had a saphenous vein graft to the distal right coronary artery.  His postoperative course was complicated. Since discharge, he said he has been feeling well overall and is back to walking daily without chest pain or shortness of breath.  His energy level is much better than it was before.  PHYSICAL EXAMINATION:  Vital Signs:  Blood pressure is 121/78, pulse 78 and regular, respiratory rate 16 AND unlabored.  Oxygen saturation on room air is now 100%.  General:  He looks well.  Cardiac:  Regular rate and rhythm with normal heart sounds.  Lung:  Clear.  Chest incision is healing well and sternum is stable.  Extremities:  His left leg vein harvest incision is healing well.  There is no peripheral edema.  Followup chest x-ray today shows clear lung fields and no pleural effusions.  His medications are enteric-coated aspirin 325 mg daily, Lopressor 25 mg daily, Lipitor 20 mg at bedtime, niacin 1000 mg at bedtime, Propecia 1 mg daily, vitamin D 1000 units daily, coenzyme Q10 b.i.d., cod liver oil b.i.d., Zoloft 50 mg daily, oxycodone p.r.n. for pain which he has not taken in a couple of weeks.  IMPRESSION:  Overall, the patient is recovering well following his surgery.  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  I asked him not to lift anything heavier than 10 pounds for a total of 3 months from date of surgery.  He has a followup appointment with Dr. Tresa Endo and will continue to see me only if he develops any problems with his incision.  Evelene Croon,  M.D. Electronically Signed  BB/MEDQ  D:  12/04/2010  T:  12/05/2010  Job:  [id number]  cc:   Nicki Guadalajara, M.D. Titus Dubin. Alwyn Ren, MD,FACP,FCCP

## 2010-12-06 LAB — POCT CARDIAC MARKERS
Myoglobin, poc: 25.4 ng/mL (ref 12–200)
Myoglobin, poc: 32 ng/mL (ref 12–200)
Troponin i, poc: <0.05 ng/mL (ref 0.00–0.09)

## 2010-12-06 LAB — DIFFERENTIAL
Basophils Absolute: 0 10*3/uL (ref 0.0–0.1)
Basophils Absolute: 0 10*3/uL (ref 0.0–0.1)
Basophils Absolute: 0 10*3/uL (ref 0.0–0.1)
Basophils Relative: 0 % (ref 0–1)
Basophils Relative: 1 % (ref 0–1)
Eosinophils Absolute: 0.2 10*3/uL (ref 0.0–0.7)
Eosinophils Absolute: 0.2 10*3/uL (ref 0.0–0.7)
Eosinophils Relative: 4 % (ref 0–5)
Monocytes Absolute: 0.7 10*3/uL (ref 0.1–1.0)
Monocytes Relative: 11 % (ref 3–12)
Neutro Abs: 2.6 10*3/uL (ref 1.7–7.7)
Neutrophils Relative %: 51 % (ref 43–77)
Neutrophils Relative %: 53 % (ref 43–77)

## 2010-12-06 LAB — GLUCOSE, CAPILLARY
Glucose-Capillary: 84 mg/dL (ref 70–99)
Glucose-Capillary: 91 mg/dL (ref 70–99)
Glucose-Capillary: 91 mg/dL (ref 70–99)

## 2010-12-06 LAB — CBC
HCT: 37.6 % — ABNORMAL LOW (ref 39.0–52.0)
HCT: 38.5 % — ABNORMAL LOW (ref 39.0–52.0)
Hemoglobin: 12.8 g/dL — ABNORMAL LOW (ref 13.0–17.0)
MCH: 30.3 pg (ref 26.0–34.0)
MCH: 30.4 pg (ref 26.0–34.0)
MCHC: 33.9 g/dL (ref 30.0–36.0)
MCHC: 34 g/dL (ref 30.0–36.0)
MCHC: 34 g/dL (ref 30.0–36.0)
MCV: 89.1 fL (ref 78.0–100.0)
MCV: 89.2 fL (ref 78.0–100.0)
MCV: 89.3 fL (ref 78.0–100.0)
Platelets: 171 10*3/uL (ref 150–400)
Platelets: 180 10*3/uL (ref 150–400)
Platelets: 188 10*3/uL (ref 150–400)
RBC: 4.36 MIL/uL (ref 4.22–5.81)
RDW: 12.6 % (ref 11.5–15.5)
RDW: 12.6 % (ref 11.5–15.5)
RDW: 12.8 % (ref 11.5–15.5)
WBC: 5.7 10*3/uL (ref 4.0–10.5)

## 2010-12-06 LAB — LIPID PANEL
HDL: 52 mg/dL (ref >39)
Total CHOL/HDL Ratio: 2.3 RATIO
Triglycerides: 40 mg/dL (ref <150)

## 2010-12-06 LAB — COMPREHENSIVE METABOLIC PANEL
ALT: 29 U/L (ref 0–53)
Alkaline Phosphatase: 59 U/L (ref 39–117)
BUN: 15 mg/dL (ref 6–23)
CO2: 24 mEq/L (ref 19–32)
Chloride: 112 mEq/L (ref 96–112)
GFR calc non Af Amer: >60 mL/min (ref >60)
Glucose, Bld: 90 mg/dL (ref 70–99)
Potassium: 3.6 mEq/L (ref 3.5–5.1)
Sodium: 142 mEq/L (ref 135–145)
Total Bilirubin: 0.4 mg/dL (ref 0.3–1.2)
Total Protein: 5.8 g/dL — ABNORMAL LOW (ref 6.0–8.3)

## 2010-12-06 LAB — POCT I-STAT, CHEM 8
BUN: 25 mg/dL — ABNORMAL HIGH (ref 6–23)
Calcium, Ion: 1.11 mmol/L — ABNORMAL LOW (ref 1.12–1.32)
Hemoglobin: 13.3 g/dL (ref 13.0–17.0)
Sodium: 140 mEq/L (ref 135–145)
TCO2: 23 mmol/L (ref 0–100)

## 2010-12-06 LAB — BASIC METABOLIC PANEL
Chloride: 105 mEq/L (ref 96–112)
Creatinine, Ser: 0.87 mg/dL (ref 0.4–1.5)
GFR calc Af Amer: >60 mL/min (ref >60)
Potassium: 3.7 mEq/L (ref 3.5–5.1)

## 2010-12-06 LAB — CARDIAC PANEL(CRET KIN+CKTOT+MB+TROPI)
CK, MB: 1.3 ng/mL (ref 0.3–4.0)
Relative Index: INVALID (ref 0.0–2.5)
Total CK: 84 U/L (ref 7–232)
Troponin I: 0.03 ng/mL (ref 0.00–0.06)

## 2010-12-06 LAB — HEPARIN LEVEL (UNFRACTIONATED): Heparin Unfractionated: 0.2 IU/mL — ABNORMAL LOW (ref 0.30–0.70)

## 2010-12-06 LAB — PROTIME-INR: Prothrombin Time: 13.1 seconds (ref 11.6–15.2)

## 2010-12-06 LAB — TSH: TSH: 0.928 u[IU]/mL (ref 0.350–4.500)

## 2010-12-06 LAB — HEMOGLOBIN A1C: Mean Plasma Glucose: 148 mg/dL — ABNORMAL HIGH (ref <117)

## 2010-12-06 LAB — MRSA PCR SCREENING: MRSA by PCR: NEGATIVE

## 2010-12-10 LAB — BASIC METABOLIC PANEL
CO2: 24 mEq/L (ref 19–32)
Calcium: 8.2 mg/dL — ABNORMAL LOW (ref 8.4–10.5)
GFR calc Af Amer: >60 mL/min (ref >60)
GFR calc non Af Amer: >60 mL/min (ref >60)
Sodium: 140 mEq/L (ref 135–145)

## 2010-12-10 LAB — CBC
Hemoglobin: 13.1 g/dL (ref 13.0–17.0)
RBC: 4.21 MIL/uL — ABNORMAL LOW (ref 4.22–5.81)
WBC: 5.4 10*3/uL (ref 4.0–10.5)

## 2010-12-12 NOTE — Discharge Summary (Signed)
NAME:  Seth Sanchez, Seth Sanchez NO.:  [id number]  MEDICAL RECORD NO.:  [id number]           PATIENT TYPE:  LOCATION:                                 FACILITY:  PHYSICIAN:  Evelene Croon, M.D.     DATE OF BIRTH:  02/26/1951  DATE OF ADMISSION: DATE OF DISCHARGE:                              DISCHARGE SUMMARY   PRIMARY ADMITTING DIAGNOSIS:  Chest pain.  ADDITIONAL/DISCHARGE DIAGNOSES: 1. Severe multivessel coronary artery disease with in-stent restenosis     of the right coronary artery. 2. Prior history of coronary artery disease, status post coronary     artery bypass graft in 1995. 3. Type 2 diabetes mellitus. 4. Dyslipidemia. 5. Remote history of tobacco abuse. 6. Postoperative acute blood loss anemia.  PROCEDURES PERFORMED: 1. Redo off-pump coronary artery bypass grafting x1 (saphenous vein     graft to the distal right coronary artery). 2. Endoscopic vein harvest, left leg. 3. Cardiac catheterization.  HISTORY:  The patient is a 60 year old male with a known history of coronary artery disease, status post CABG x2 in 1995 by Dr. Laneta Sanchez. Since that time, he has had multiple percutaneous interventions to the right coronary artery with placement of stents as well as cutting balloon angioplasty and ultimately brachytherapy in October 2011.  He presented on the date of this admission with chest pain and recurrent anginal-type symptoms.  EKG changes led to admission and further workup by Cardiology.  HOSPITAL COURSE:  Seth Sanchez was admitted and was noted to have T-wave inversions on his EKG in lead III.  It was felt that in light of his EKG changes and his recurrent coronary artery disease and his ongoing symptoms, he should undergo repeat catheterization.  This was performed on November 15, 2010, and he was noted to have high-grade mid right coronary artery stenosis within the previous stented segment.  The left internal mammary artery graft to the LAD was widely  patent.  The left circumflex had no significant stenosis and left ventricular function was well preserved.  It was felt that at this point, he would benefit from consideration of redo coronary revascularization.  Dr. Laneta Sanchez saw the patient in consultation and agreed with the need for redo CABG.  Because the patient had been on Effient, it was felt that he should wait approximately 1 week for a washout prior to surgery.  He was continued on Integrilin throughout this time and remained asymptomatic.  All risks, benefits, and alternatives of surgery were explained to the patient.  He agreed to proceed.  Initially, use of a radial artery graft was considered, however, the right arm was radial dependent via Dopplers and was not usable and the left radial had been used for catheterization with some difficulty and it was felt that the left radial would not be suitable either.  Also, the right internal mammary artery was considered but because of the patient's body habitus and short stature, it was felt that the right mammary would not be adequate to reach to the distal right coronary artery.  Therefore, it was felt that a vein graft should be  utilized at this time.  He was taken to the operating room on November 12, 2010, and underwent coronary artery bypass grafting (redo off-pump) x1 by Dr. Laneta Sanchez.  Please see previously dictated operative report for complete details of surgery.  He tolerated the procedure well and was transferred to the SICU in stable condition.  He was extubated shortly after surgery and was hemodynamically stable and doing well on postop day #1.  At that time, his hemodynamic monitoring devices and chest tubes were removed and he was able to be transferred to the step- down unit.  Overall, his postoperative course has been uneventful.  He has been afebrile and his vital signs have been stable.  He has maintained normal sinus rhythm.  He had a mild acute blood loss  anemia which has been observed and is resolving without treatment.  His labs on postop day #2 showed a hemoglobin of 11.3, hematocrit 34.4, platelets 134, white count 10.6, sodium 140, potassium 4.1, BUN 12, creatinine 1.10.  Chest x-ray is stable with tiny bilateral pleural effusions and no pneumothorax.  He was seen and evaluated by Dr. Laneta Sanchez on postop day #3, October 26, 2010, and was deemed ready for discharge home.  At the time of discharge, he was tolerating a regular diet, was having normal bowel and bladder function.  He was ambulating with cardiac rehab phase 1 without difficulty.  DISCHARGE MEDICATIONS: 1. Enteric-coated aspirin 325 mg daily. 2. Metoprolol 25 mg b.i.d. 3. Oxycodone IR 5-10 mg q.4-6 hours p.r.n. for pain. 4. Cod liver oil tablets b.i.d. 5. CoQ10 b.i.d. 6. Lipitor 20 mg at bedtime. 7. Niacin SR 1000 mg at bedtime. 8. Propecia 1 mg daily. 9. Seroquel 50 mg daily. 10.Vitamin D 1000 units daily.  DISCHARGE INSTRUCTIONS:  He was asked to refrain from driving, heavy lifting, or strenuous activity.  He may continue ambulating daily and using his incentive spirometer.  He may shower daily and clean hisincisions with soap and water.  He will continue his same preoperative diet.  DISCHARGE FOLLOWUP:  He will need to make an appointment to see Dr. Tresa Endo in 2 weeks.  He will then follow up with Dr. Laneta Sanchez on December 04, 2010, with a chest x-ray from [facility].  In the interim, if he experiences any problems or has questions, he was asked to contact our office immediately.     Coral Ceo, P.A.   ______________________________ Evelene Croon, M.D.    GC/MEDQ  D:  11/28/2010  T:  11/28/2010  Job:  [id number]  cc:   Dr. Elsie Ra Office  Electronically Signed by Coral Ceo P.A. on 12/05/2010 10:29:57 AM Electronically Signed by Evelene Croon M.D. on 12/12/2010 09:56:48 AM

## 2010-12-17 LAB — BASIC METABOLIC PANEL
CO2: 26 mEq/L (ref 19–32)
Chloride: 109 mEq/L (ref 96–112)
Glucose, Bld: 110 mg/dL — ABNORMAL HIGH (ref 70–99)
Potassium: 3.9 mEq/L (ref 3.5–5.1)
Sodium: 139 mEq/L (ref 135–145)

## 2010-12-17 LAB — CBC
HCT: 37.1 % — ABNORMAL LOW (ref 39.0–52.0)
Hemoglobin: 12.6 g/dL — ABNORMAL LOW (ref 13.0–17.0)
MCHC: 34 g/dL (ref 30.0–36.0)
RDW: 13.3 % (ref 11.5–15.5)

## 2010-12-29 LAB — CBC
HCT: 37.2 % — ABNORMAL LOW (ref 39.0–52.0)
Hemoglobin: 12.8 g/dL — ABNORMAL LOW (ref 13.0–17.0)
Hemoglobin: 14.9 g/dL (ref 13.0–17.0)
MCHC: 34.1 g/dL (ref 30.0–36.0)
MCHC: 35 g/dL (ref 30.0–36.0)
MCV: 88.8 fL (ref 78.0–100.0)
MCV: 88.8 fL (ref 78.0–100.0)
MCV: 89.5 fL (ref 78.0–100.0)
Platelets: 157 10*3/uL (ref 150–400)
RBC: 3.7 MIL/uL — ABNORMAL LOW (ref 4.22–5.81)
RBC: 3.96 MIL/uL — ABNORMAL LOW (ref 4.22–5.81)
RBC: 4.16 MIL/uL — ABNORMAL LOW (ref 4.22–5.81)
RBC: 4.51 MIL/uL (ref 4.22–5.81)
RBC: 4.84 MIL/uL (ref 4.22–5.81)
RDW: 13.2 % (ref 11.5–15.5)
WBC: 10.9 10*3/uL — ABNORMAL HIGH (ref 4.0–10.5)
WBC: 8.7 10*3/uL (ref 4.0–10.5)
WBC: 9 10*3/uL (ref 4.0–10.5)

## 2010-12-29 LAB — LIPID PANEL
Total CHOL/HDL Ratio: 3.2 RATIO
VLDL: 10 mg/dL (ref 0–40)

## 2010-12-29 LAB — BASIC METABOLIC PANEL
CO2: 24 mEq/L (ref 19–32)
CO2: 25 mEq/L (ref 19–32)
CO2: 27 mEq/L (ref 19–32)
Calcium: 8.6 mg/dL (ref 8.4–10.5)
Chloride: 105 mEq/L (ref 96–112)
Chloride: 108 mEq/L (ref 96–112)
Chloride: 110 mEq/L (ref 96–112)
Creatinine, Ser: 0.82 mg/dL (ref 0.4–1.5)
Creatinine, Ser: 0.91 mg/dL (ref 0.4–1.5)
Creatinine, Ser: 0.98 mg/dL (ref 0.4–1.5)
Creatinine, Ser: 1.12 mg/dL (ref 0.4–1.5)
GFR calc Af Amer: >60 mL/min (ref >60)
GFR calc Af Amer: >60 mL/min (ref >60)
GFR calc Af Amer: >60 mL/min (ref >60)
GFR calc non Af Amer: >60 mL/min (ref >60)
Glucose, Bld: 100 mg/dL — ABNORMAL HIGH (ref 70–99)
Glucose, Bld: 125 mg/dL — ABNORMAL HIGH (ref 70–99)
Glucose, Bld: 139 mg/dL — ABNORMAL HIGH (ref 70–99)
Potassium: 3.1 mEq/L — ABNORMAL LOW (ref 3.5–5.1)
Potassium: 3.6 mEq/L (ref 3.5–5.1)
Potassium: 3.7 mEq/L (ref 3.5–5.1)
Sodium: 137 mEq/L (ref 135–145)
Sodium: 139 mEq/L (ref 135–145)

## 2010-12-29 LAB — DIFFERENTIAL
Basophils Relative: 0 % (ref 0–1)
Eosinophils Absolute: 0.1 10*3/uL (ref 0.0–0.7)
Monocytes Absolute: 1.3 10*3/uL — ABNORMAL HIGH (ref 0.1–1.0)
Monocytes Relative: 15 % — ABNORMAL HIGH (ref 3–12)
Neutrophils Relative %: 70 % (ref 43–77)

## 2010-12-29 LAB — MAGNESIUM: Magnesium: 2 mg/dL (ref 1.5–2.5)

## 2010-12-29 LAB — POCT CARDIAC MARKERS
CKMB, poc: 1.2 ng/mL (ref 1.0–8.0)
Myoglobin, poc: 45.9 ng/mL (ref 12–200)

## 2010-12-29 LAB — HEMOGLOBIN A1C: Mean Plasma Glucose: 123 mg/dL

## 2010-12-29 LAB — CK TOTAL AND CKMB (NOT AT ARMC)
CK, MB: 46.5 ng/mL — ABNORMAL HIGH (ref 0.3–4.0)
Total CK: 630 U/L — ABNORMAL HIGH (ref 7–232)

## 2010-12-29 LAB — PROTIME-INR: Prothrombin Time: 11.7 seconds (ref 11.6–15.2)

## 2010-12-31 ENCOUNTER — Other Ambulatory Visit: Payer: Self-pay | Admitting: Cardiovascular Disease

## 2010-12-31 ENCOUNTER — Encounter (HOSPITAL_COMMUNITY): Payer: 59 | Attending: Cardiovascular Disease

## 2010-12-31 DIAGNOSIS — I252 Old myocardial infarction: Secondary | ICD-10-CM | POA: Insufficient documentation

## 2010-12-31 DIAGNOSIS — Z9861 Coronary angioplasty status: Secondary | ICD-10-CM | POA: Insufficient documentation

## 2010-12-31 DIAGNOSIS — Z7982 Long term (current) use of aspirin: Secondary | ICD-10-CM | POA: Insufficient documentation

## 2010-12-31 DIAGNOSIS — Z5189 Encounter for other specified aftercare: Secondary | ICD-10-CM | POA: Insufficient documentation

## 2010-12-31 DIAGNOSIS — Z87891 Personal history of nicotine dependence: Secondary | ICD-10-CM | POA: Insufficient documentation

## 2010-12-31 DIAGNOSIS — Z7902 Long term (current) use of antithrombotics/antiplatelets: Secondary | ICD-10-CM | POA: Insufficient documentation

## 2010-12-31 DIAGNOSIS — I25810 Atherosclerosis of coronary artery bypass graft(s) without angina pectoris: Secondary | ICD-10-CM | POA: Insufficient documentation

## 2010-12-31 DIAGNOSIS — I200 Unstable angina: Secondary | ICD-10-CM | POA: Insufficient documentation

## 2010-12-31 DIAGNOSIS — E785 Hyperlipidemia, unspecified: Secondary | ICD-10-CM | POA: Insufficient documentation

## 2010-12-31 DIAGNOSIS — E119 Type 2 diabetes mellitus without complications: Secondary | ICD-10-CM | POA: Insufficient documentation

## 2011-01-02 ENCOUNTER — Other Ambulatory Visit: Payer: Self-pay | Admitting: Cardiovascular Disease

## 2011-01-02 ENCOUNTER — Encounter (HOSPITAL_COMMUNITY): Payer: 59

## 2011-01-04 ENCOUNTER — Other Ambulatory Visit: Payer: Self-pay | Admitting: Cardiovascular Disease

## 2011-01-04 ENCOUNTER — Encounter (HOSPITAL_COMMUNITY): Payer: 59

## 2011-01-07 ENCOUNTER — Encounter (HOSPITAL_COMMUNITY): Payer: 59

## 2011-01-09 ENCOUNTER — Encounter (HOSPITAL_COMMUNITY): Payer: 59

## 2011-01-11 ENCOUNTER — Encounter (HOSPITAL_COMMUNITY): Payer: 59

## 2011-01-14 ENCOUNTER — Encounter (HOSPITAL_COMMUNITY): Payer: 59

## 2011-01-16 ENCOUNTER — Encounter (HOSPITAL_COMMUNITY): Payer: 59

## 2011-01-18 ENCOUNTER — Encounter (HOSPITAL_COMMUNITY): Payer: 59

## 2011-01-21 ENCOUNTER — Encounter (HOSPITAL_COMMUNITY): Payer: 59

## 2011-01-23 ENCOUNTER — Encounter (HOSPITAL_COMMUNITY): Payer: 59 | Attending: Cardiovascular Disease

## 2011-01-23 DIAGNOSIS — Z7982 Long term (current) use of aspirin: Secondary | ICD-10-CM | POA: Insufficient documentation

## 2011-01-23 DIAGNOSIS — E785 Hyperlipidemia, unspecified: Secondary | ICD-10-CM | POA: Insufficient documentation

## 2011-01-23 DIAGNOSIS — Z9861 Coronary angioplasty status: Secondary | ICD-10-CM | POA: Insufficient documentation

## 2011-01-23 DIAGNOSIS — I200 Unstable angina: Secondary | ICD-10-CM | POA: Insufficient documentation

## 2011-01-23 DIAGNOSIS — Z5189 Encounter for other specified aftercare: Secondary | ICD-10-CM | POA: Insufficient documentation

## 2011-01-23 DIAGNOSIS — Z7902 Long term (current) use of antithrombotics/antiplatelets: Secondary | ICD-10-CM | POA: Insufficient documentation

## 2011-01-23 DIAGNOSIS — I25810 Atherosclerosis of coronary artery bypass graft(s) without angina pectoris: Secondary | ICD-10-CM | POA: Insufficient documentation

## 2011-01-23 DIAGNOSIS — Z87891 Personal history of nicotine dependence: Secondary | ICD-10-CM | POA: Insufficient documentation

## 2011-01-23 DIAGNOSIS — E119 Type 2 diabetes mellitus without complications: Secondary | ICD-10-CM | POA: Insufficient documentation

## 2011-01-23 DIAGNOSIS — I252 Old myocardial infarction: Secondary | ICD-10-CM | POA: Insufficient documentation

## 2011-01-25 ENCOUNTER — Encounter (HOSPITAL_COMMUNITY): Payer: 59

## 2011-01-28 ENCOUNTER — Encounter (HOSPITAL_COMMUNITY): Payer: 59

## 2011-01-30 ENCOUNTER — Encounter (HOSPITAL_COMMUNITY): Payer: 59

## 2011-02-01 ENCOUNTER — Encounter (HOSPITAL_COMMUNITY): Payer: 59

## 2011-02-04 ENCOUNTER — Encounter (HOSPITAL_COMMUNITY): Payer: 59

## 2011-02-05 NOTE — Procedures (Signed)
NAME:  Seth Sanchez, Seth Sanchez NO.:  [id number]   MEDICAL RECORD NO.:  [id number]          PATIENT TYPE:  OUT   LOCATION:  SLEEP CENTER                 FACILITY:  [facility]   PHYSICIAN:  Clinton D. Maple Hudson, MD, FCCP, FACPDATE OF BIRTH:  1950-10-09   DATE OF STUDY:  06/01/2008                            NOCTURNAL POLYSOMNOGRAM   REFERRING PHYSICIAN:   INDICATION FOR STUDY:  Hypersomnia with sleep apnea.   EPWORTH SLEEPINESS SCORE:  10/24, BMI 29.6, weight 178 pounds, height 65  inches, neck 15.5 inches.   HOME MEDICATION:  Charted and reviewed.   SLEEP ARCHITECTURE:  Total sleep time 318 minutes with sleep efficiency  86.9%.  Stage I was 17.5%, stage II 66.2%, stage III absent, REM 16.4%  of total sleep time.  Sleep latency 12.5 minutes, REM latency 115.5  minutes, awake after sleep onset 37 minutes, arousal index 32.3.  No  bedtime medication was taken.   RESPIRATORY DATA:  Diagnostic NPSG protocol.  Apnea-hypopnea index (AHI)  25.8 per hour, respiratory disturbance index (RDI) 33.2 per hour,  respiratory events related to arousal 39, 7.4 per hour.  A total of 137  events were scored including 6 obstructive apneas, 97 event central  apneas, 34 hypopneas.  Events were not positional, REM AHI 4.6 per hour.   OXYGEN DATA:  Very loud snoring with oxygen desaturation to a nadir of  85%.  Mean oxygen saturation through the study was 95.4% on room air.   CARDIAC DATA:  Normal sinus rhythm.   MOVEMENT/PARASOMNIA:  Occasional limb jerk with arousal, averaging 0.8  per hour, insignificant.   IMPRESSION/RECOMMENDATION:  1. Moderate obstructive sleep apnea/hypopnea syndrome, apnea-hypopnea      index (AHI) 25.8 per hour with nonpositional events, very loud      snoring and oxygen desaturation to a nadir of 85%.  2. This was ordered as a diagnostic nocturnal polysomnogram (NPSG)      protocol.  Consider return for CPAP titration or evaluate for      alternative management as  clinically appropriate.      Clinton D. Maple Hudson, MD, [facility], FACP  Diplomate, Biomedical engineer of Sleep Medicine  Electronically Signed     CDY/MEDQ  D:  06/04/2008 11:23:33  T:  06/04/2008 12:56:03  Job:  [id number]

## 2011-02-05 NOTE — Discharge Summary (Signed)
NAME:  Seth Sanchez, Seth Sanchez NO.:  [id number]   MEDICAL RECORD NO.:  [id number]          PATIENT TYPE:  INP   LOCATION:  2009                         FACILITY:  [facility]   PHYSICIAN:  Nicki Guadalajara, M.D.     DATE OF BIRTH:  [birth year]   DATE OF ADMISSION:  05/14/2009  DATE OF DISCHARGE:  05/18/2009                               DISCHARGE SUMMARY   DISCHARGE DIAGNOSES:  1. 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  2. Coronary artery bypass grafting in 1995 with a right coronary      artery stent placed in December 1999.  3. Treated dyslipidemia.   HOSPITAL COURSE:  The patient is a 60 year old male who has been  followed by Dr. Elsie Lincoln.  He had bypass surgery 15 years ago and then had  a subsequent RCA intervention.  He was on Lipitor and aspirin at home.  He had chest pain associated with diaphoresis and shortness of breath  while washing his car on May 14, 2009.  EKG was consistent with ST  elevation MI and was brought urgently to the Cath Lab by Dr. Tresa Endo at 11  p.m. on May 14, 2009.  Catheterization revealed a patent LIMA to the  LAD with 60% narrowing in the left main and a patent circumflex and  patent OMs.  The RCA was totaled at the old stent.  He underwent  intervention with three new stents.  CKs peaked at 630 with 46 MBs.  He  did have some baseline bradycardia, but otherwise tolerated the  procedure well.  He was transferred to the floor and ambulated and we  feel he can be discharged on May 18, 2009.  He has had a sleep study  and is on CPAP.  The sleep study was on November 2009.   LABORATORY DATA:  At discharge, white count was 8.8, hemoglobin 13.6,  hematocrit 40, platelets 181.  Sodium 138, potassium 4.2, BUN 11,  creatinine 0.9.  Lipid panel shows a cholesterol 110, HDL 48, LDL 66.  TSH 0.59.  CKs peaked at 630, 46 MBs, hemoglobin A1c is 5.9.  Chest x-  ray no acute changes.  EKG shows  sinus rhythm with sinus bradycardia  with Qs in III and aVF.   DISCHARGE MEDICATIONS:  1. Metoprolol 25 mg b.i.d.  2. Niaspan 500 mg at bedtime.  3. Altace 2.5 mg a day.  4. Lipitor 40 mg a day.  5. Aspirin 325 mg a day.  6. Lamictal 200 mg a day.  7. Pepcid 20 mg a day.  8. Effient 10 mg a day.  9. Nitroglycerin 0.4 mg sublingual p.r.n.   He will follow up with Dr. Tresa Endo in a couple of weeks in the office.      Abelino Derrick, P.A.    ______________________________  Nicki Guadalajara, M.D.    Lenard Lance  D:  05/18/2009  T:  05/18/2009  Job:  [id number]

## 2011-02-05 NOTE — Procedures (Signed)
NAME:  Seth Sanchez, Seth Sanchez NO.:  [id number]   MEDICAL RECORD NO.:  [id number]          PATIENT TYPE:  OUT   LOCATION:  SLEEP CENTER                 FACILITY:  [facility]   PHYSICIAN:  Clinton D. Maple Hudson, MD, FCCP, FACPDATE OF BIRTH:  1951/03/13   DATE OF STUDY:  05/19/2007                            NOCTURNAL POLYSOMNOGRAM   REFERRING PHYSICIAN:   REFERRING PHYSICIAN:  Madaline Savage, M.D.   INDICATION FOR STUDY:  Hypersomnia with sleep apnea.   EPWORTH SLEEPINESS SCORE:  6/24.  BMI 27.4.  Weight 165 pounds.   MEDICATIONS:  Home medications are listed and reviewed.   SLEEP ARCHITECTURE:  Total sleep time 348 minutes with sleep efficiency  87%.  Stage I was 11%.  Stage II 76%.  Stage III absent.  REM 13% of  total sleep time.  Sleep latency 6 minutes.  REM latency 59 minutes.  Awake after sleep onset 43 minutes.  Arousal index 12.2.  No bedtime  medication was taken.   RESPIRATORY DATA:  Apnea-hypopnea index (AHI/RDI) 3.4 obstructive events  per hour, which is within normal limits (normal range 0-5 per hour).  This included three central apneas, one obstructive apnea, and 16  hypopneas.  The events were seen in all sleep positions but much more  common while supine (AHI 60.2).  REM AHI 2.6.   OXYGEN DATA:  Moderate snoring with oxygen desaturation nadir 84%.  Mean  oxygen saturation through the study was 94% on room air.  Occasional  respiratory disturbance in sleep (AHI 3.4 per hour), normal range 0-5  per hour.  Moderate snoring with oxygen desaturation nadir transiently  to 84%.   CARDIAC DATA:  Normal sinus rhythm.   MOVEMENT-PARASOMNIA:  Occasional limb jerks but with little sleep  disturbance.  Bathroom x1.   IMPRESSIONS-RECOMMENDATIONS:  Occasional sleep-disordered breathing  events, within normal limits, AHI 3.4 per hour (normal range 0-5 per  hour) with moderate snoring, oxygen desaturation to a nadir of 85%.      Clinton D. Maple Hudson, MD, FCCP, FACP  Diplomate, Biomedical engineer of Sleep Medicine  Electronically Signed     CDY/MEDQ  D:  05/24/2007 12:30:24  T:  05/24/2007 16:32:15  Job:  [id number]

## 2011-02-06 ENCOUNTER — Other Ambulatory Visit: Payer: Self-pay | Admitting: Cardiovascular Disease

## 2011-02-06 ENCOUNTER — Encounter (HOSPITAL_COMMUNITY): Payer: 59

## 2011-02-06 LAB — GLUCOSE, CAPILLARY
Glucose-Capillary: 67 mg/dL — ABNORMAL LOW (ref 70–99)
Glucose-Capillary: 85 mg/dL (ref 70–99)

## 2011-02-08 ENCOUNTER — Encounter (HOSPITAL_COMMUNITY): Payer: 59

## 2011-02-08 NOTE — Procedures (Signed)
NAME:  Seth Sanchez, Seth Sanchez NO.:  [id number]   MEDICAL RECORD NO.:  [id number]          PATIENT TYPE:  OUT   LOCATION:  SLEEP CENTER                 [facility]AC[facility]   PHYSICIAN:  Clinton D. Maple Hudson, MD, FCCP, FACPDATE OF BIRTH:  [birth year]   DATE OF STUDY:                            NOCTURNAL POLYSOMNOGRAM   REFERRING PHYSICIAN:   INDICATION FOR STUDY:  Hypersomnia with sleep apnea.   EPWORTH SLEEPINESS SCORE:  10/24.   BMI 29.6.  Weight 278 pounds.  Height 65 inches.  Neck 15.5 inches.   MEDICATIONS:  Home medications charted and reviewed.  A diagnostic study  was done on 05/19/2007 recording at AHI of 3.4 per hour.  A repeat  diagnostic study was then done on 06/01/2008 recording an AHI of 25.8  per hour.  He is sent now for CPAP titration.   SLEEP ARCHITECTURE:  Total sleep time 330 minutes with sleep efficiency  82.2%.  Stage I was 9.5%.  Stage II 73.5%.  Stage III absent.  REM 17%  of total sleep time.  Sleep latency 4.5 minutes.  REM latency 81  minutes.  Wake after sleep onset 51.5 minutes.  Arousal index 14.7, but  no bedtime medication was taken.   RESPIRATORY DATA:  CPAP titration protocol.  CPAP was titrated to 8 CWP.  AHI 0.7 per hour.  He chose a medium ResMed Mirage Quattro full face  mask with heated humidifier.   OXYGEN DATA:  Snoring was prevented by CPAP and oxygen saturation held  at a mean of 96% on room air on CPAP.   CARDIAC DATA:  Normal sinus rhythm.   MOVEMENT-PARASOMNIA:  Occasional limb jerks, but none causing sleep  disturbance.  Bathroom x1.   IMPRESSIONS-RECOMMENDATIONS:  1. Successful CPAP titration to 8 CWP, AHI 0 per hour.  He chose a      medium ResMed Mirage Quattro full mask      with heated humidifier.  2. Baseline diagnostic NPSG on 06/01/2008 had recorded AHI of 25.8 per      hour.      Clinton D. Maple Hudson, MD, FCCP, FACP  Diplomate, Biomedical engineer of Sleep Medicine  Electronically Signed     CDY/MEDQ  D:   08/13/2008 14:25:30  T:  08/14/2008 00:08:16  Job:  [id number]

## 2011-02-11 ENCOUNTER — Encounter (HOSPITAL_COMMUNITY): Payer: 59

## 2011-02-13 ENCOUNTER — Encounter (HOSPITAL_COMMUNITY): Payer: 59

## 2011-02-15 ENCOUNTER — Encounter (HOSPITAL_COMMUNITY): Payer: 59

## 2011-02-15 ENCOUNTER — Other Ambulatory Visit: Payer: Commercial Managed Care - PPO

## 2011-02-15 ENCOUNTER — Other Ambulatory Visit: Payer: Self-pay | Admitting: *Deleted

## 2011-02-15 DIAGNOSIS — E119 Type 2 diabetes mellitus without complications: Secondary | ICD-10-CM

## 2011-02-18 ENCOUNTER — Encounter (HOSPITAL_COMMUNITY): Payer: 59

## 2011-02-19 ENCOUNTER — Other Ambulatory Visit (INDEPENDENT_AMBULATORY_CARE_PROVIDER_SITE_OTHER): Payer: 59

## 2011-02-19 DIAGNOSIS — E119 Type 2 diabetes mellitus without complications: Secondary | ICD-10-CM

## 2011-02-19 LAB — MICROALBUMIN / CREATININE URINE RATIO: Microalb, Ur: 3.1 mg/dL — ABNORMAL HIGH (ref 0.0–1.9)

## 2011-02-19 LAB — HEMOGLOBIN A1C: Hgb A1c MFr Bld: 5.9 % (ref 4.6–6.5)

## 2011-02-20 ENCOUNTER — Encounter (HOSPITAL_COMMUNITY): Payer: 59

## 2011-02-21 ENCOUNTER — Encounter: Payer: Self-pay | Admitting: Internal Medicine

## 2011-02-22 ENCOUNTER — Encounter: Payer: Self-pay | Admitting: Internal Medicine

## 2011-02-22 ENCOUNTER — Ambulatory Visit (INDEPENDENT_AMBULATORY_CARE_PROVIDER_SITE_OTHER): Payer: 59 | Admitting: Internal Medicine

## 2011-02-22 ENCOUNTER — Encounter (HOSPITAL_COMMUNITY): Payer: 59

## 2011-02-22 VITALS — BP 122/70 | HR 72 | Wt 171.4 lb

## 2011-02-22 DIAGNOSIS — E119 Type 2 diabetes mellitus without complications: Secondary | ICD-10-CM

## 2011-02-22 NOTE — Progress Notes (Signed)
Subjective:    Patient ID: Seth Sanchez, male    DOB: 10-Jul-1951, 60 y.o.   MRN: [id number]  HPI Diabetes status assessment: Fasting or morning glucose range:  90- 112 or average :  100  . Highest glucose 2 hours after any meal:  170+,usually < 140. Hypoglycemia : yes post Cardiac rehab .                                                     Excess thirst, hunger,urination:  no.                                  Lightheadedness with standing:  no. Chest pain, Palpitations ,Pain in  calves with walking:  no .                                                                                                                                Non healing skin  ulcers or sores,especially over the feet:  no. Numbness or tingling or burning in feet : no .                                                                                                                                              Significant change in  Weight : stable. Vision changes : no  .                                                                    Exercise : 4X/week, HR up to 150 . Nutrition/diet:  Heart healthy, low carb. Medication compliance : yes. Medication adverse  Effects:  See Hypoglycemia . Eye exam : due. Foot care : no.  A1c/ urine microalbumin monitor:  5.9%, 3.1        Review of Systems     Objective:   Physical Exam Heart:  Slow  rate and regular rhythm. S1 and S2 normal without gallop, murmur, click, rub or other extra sounds S4 with slurring.Lungs:Chest clear to auscultation; no wheezes, rhonchi,rales ,or rubs present.No increased work of breathing.  All pulses intact without  bruits .No ischemic skin changes.  Skin:  Intact without suspicious lesions or rashes . Light touch normal over feet        Assessment & Plan:  #1 diabetes, essentially resolved with lifestyle and minimal medications (metformin 500 mg one half pill daily)  Plan: #1 discontinue metformin. Recheck A1c and urine microalbumin in 4  months.

## 2011-02-22 NOTE — Patient Instructions (Signed)
Diabetes Monitor   The A1c test checks the average amount of glucose (sugar) in the blood over the last 2 to 3 months.As glucose circulates in the blood, some of it binds to hemoglobin A. This is the main form of hemoglobin in adults. Hemoglobin is a red protein that carries oxygen in the red blood cells (RBC's). Once the glucose is bound to the hemoglobin A, it remains there for the life of the red blood cell (about 120 days). This combination of glucose and hemoglobin A is called A1c (or hemoglobin A1c or glycohemoglobin). Increased glucose in the blood, increases the hemoglobin A1c. A1c levels do not change quickly but will shift as older RBC's die and younger ones take their place.   The A1c test is used primarily to monitor the glucose control of diabetics over time. The goal of those with diabetes is to keep their blood glucose levels as close to normal as possible. This helps to minimize the complications caused by chronically elevated glucose levels, such as progressive damage to body organs like the kidneys, eyes, cardiovascular system, and nerves. The A1c test gives a picture of the average amount of glucose in the blood over the last few months. It can help a patient and his doctor know if the measures they are taking to control the patient's diabetes are successful or need to be adjusted.     Depending on the type of diabetes that you have, how well your diabetes is controlled, your A1c may be measured 2 to 4 times each year. When someone is first diagnosed with diabetes or if control is not good, A1c may be ordered more frequently.   NORMAL VALUES  Non diabetic adults: 5 %-6.1%  Good diabetic control: 6.2-6.4 %  Fair diabetic control: 6.5-7%  Poor diabetic control: greater than 7 % ( except with additional factors such as  advanced age; significant coronary or neurologic disease,etc). Check the A1c every 6 months if it is < 6.5%; every 4 months if  6.5% or higher. Goals for home glucose  monitoring are : fasting  or morning glucose goal of  90-150. Two hours after any meal , goal = < 180, preferably < 150.      A1c & microalbumin in 4 months(250.00).[employer]

## 2011-02-25 ENCOUNTER — Encounter (HOSPITAL_COMMUNITY): Payer: 59 | Attending: Cardiovascular Disease

## 2011-02-25 DIAGNOSIS — Z9861 Coronary angioplasty status: Secondary | ICD-10-CM | POA: Insufficient documentation

## 2011-02-25 DIAGNOSIS — Z7902 Long term (current) use of antithrombotics/antiplatelets: Secondary | ICD-10-CM | POA: Insufficient documentation

## 2011-02-25 DIAGNOSIS — I252 Old myocardial infarction: Secondary | ICD-10-CM | POA: Insufficient documentation

## 2011-02-25 DIAGNOSIS — Z7982 Long term (current) use of aspirin: Secondary | ICD-10-CM | POA: Insufficient documentation

## 2011-02-25 DIAGNOSIS — I200 Unstable angina: Secondary | ICD-10-CM | POA: Insufficient documentation

## 2011-02-25 DIAGNOSIS — Z5189 Encounter for other specified aftercare: Secondary | ICD-10-CM | POA: Insufficient documentation

## 2011-02-25 DIAGNOSIS — E119 Type 2 diabetes mellitus without complications: Secondary | ICD-10-CM | POA: Insufficient documentation

## 2011-02-25 DIAGNOSIS — E785 Hyperlipidemia, unspecified: Secondary | ICD-10-CM | POA: Insufficient documentation

## 2011-02-25 DIAGNOSIS — Z87891 Personal history of nicotine dependence: Secondary | ICD-10-CM | POA: Insufficient documentation

## 2011-02-25 DIAGNOSIS — I25810 Atherosclerosis of coronary artery bypass graft(s) without angina pectoris: Secondary | ICD-10-CM | POA: Insufficient documentation

## 2011-02-26 NOTE — Progress Notes (Signed)
Labs only

## 2011-02-27 ENCOUNTER — Encounter (HOSPITAL_COMMUNITY): Payer: 59

## 2011-03-01 ENCOUNTER — Encounter (HOSPITAL_COMMUNITY): Payer: 59

## 2011-03-04 ENCOUNTER — Encounter (HOSPITAL_COMMUNITY): Payer: 59

## 2011-03-06 ENCOUNTER — Encounter (HOSPITAL_COMMUNITY): Payer: 59

## 2011-03-08 ENCOUNTER — Encounter (HOSPITAL_COMMUNITY): Payer: 59

## 2011-03-11 ENCOUNTER — Encounter (HOSPITAL_COMMUNITY): Payer: 59

## 2011-03-13 ENCOUNTER — Encounter (HOSPITAL_COMMUNITY): Payer: 59

## 2011-03-15 ENCOUNTER — Encounter (HOSPITAL_COMMUNITY): Payer: 59

## 2011-03-18 ENCOUNTER — Encounter (HOSPITAL_COMMUNITY): Payer: 59

## 2011-03-20 ENCOUNTER — Encounter (HOSPITAL_COMMUNITY): Payer: 59

## 2011-03-22 ENCOUNTER — Encounter (HOSPITAL_COMMUNITY): Payer: 59

## 2011-03-25 ENCOUNTER — Encounter (HOSPITAL_COMMUNITY): Payer: 59

## 2011-03-27 ENCOUNTER — Encounter (HOSPITAL_COMMUNITY): Payer: 59

## 2011-03-29 ENCOUNTER — Encounter (HOSPITAL_COMMUNITY): Payer: 59

## 2011-04-01 ENCOUNTER — Encounter (HOSPITAL_COMMUNITY): Payer: 59 | Attending: Cardiovascular Disease

## 2011-04-01 DIAGNOSIS — E785 Hyperlipidemia, unspecified: Secondary | ICD-10-CM | POA: Insufficient documentation

## 2011-04-01 DIAGNOSIS — Z7902 Long term (current) use of antithrombotics/antiplatelets: Secondary | ICD-10-CM | POA: Insufficient documentation

## 2011-04-01 DIAGNOSIS — Z87891 Personal history of nicotine dependence: Secondary | ICD-10-CM | POA: Insufficient documentation

## 2011-04-01 DIAGNOSIS — Z5189 Encounter for other specified aftercare: Secondary | ICD-10-CM | POA: Insufficient documentation

## 2011-04-01 DIAGNOSIS — I200 Unstable angina: Secondary | ICD-10-CM | POA: Insufficient documentation

## 2011-04-01 DIAGNOSIS — Z7982 Long term (current) use of aspirin: Secondary | ICD-10-CM | POA: Insufficient documentation

## 2011-04-01 DIAGNOSIS — I25810 Atherosclerosis of coronary artery bypass graft(s) without angina pectoris: Secondary | ICD-10-CM | POA: Insufficient documentation

## 2011-04-01 DIAGNOSIS — Z9861 Coronary angioplasty status: Secondary | ICD-10-CM | POA: Insufficient documentation

## 2011-04-01 DIAGNOSIS — E119 Type 2 diabetes mellitus without complications: Secondary | ICD-10-CM | POA: Insufficient documentation

## 2011-04-01 DIAGNOSIS — I252 Old myocardial infarction: Secondary | ICD-10-CM | POA: Insufficient documentation

## 2011-04-03 ENCOUNTER — Encounter (HOSPITAL_COMMUNITY): Payer: 59

## 2011-04-05 ENCOUNTER — Encounter (HOSPITAL_COMMUNITY): Payer: 59

## 2011-05-29 ENCOUNTER — Ambulatory Visit (INDEPENDENT_AMBULATORY_CARE_PROVIDER_SITE_OTHER): Payer: 59 | Admitting: Internal Medicine

## 2011-05-29 DIAGNOSIS — R194 Change in bowel habit: Secondary | ICD-10-CM

## 2011-05-29 DIAGNOSIS — R198 Other specified symptoms and signs involving the digestive system and abdomen: Secondary | ICD-10-CM

## 2011-05-29 DIAGNOSIS — E119 Type 2 diabetes mellitus without complications: Secondary | ICD-10-CM

## 2011-05-29 DIAGNOSIS — R109 Unspecified abdominal pain: Secondary | ICD-10-CM

## 2011-05-29 MED ORDER — HYOSCYAMINE SULFATE 0.125 MG SL SUBL: 0.1250 mg | SUBLINGUAL_TABLET | SUBLINGUAL | Status: AC | PRN

## 2011-05-29 NOTE — Patient Instructions (Signed)
Please take the probiotic , Align, every day until the bowels are normal. This will replace the normal bacteria which  are necessary for formation of normal stool and processing of food.  As per the Standard of Care , screening Colonoscopy recommended @ 50 & every 5-10 years thereafter . More frequent monitor would be dictated by family history or findings @ Colonoscopy .Please ask your cardiologist to clear you for a screening colonoscopy.

## 2011-05-29 NOTE — Progress Notes (Signed)
  Subjective:    Patient ID: Seth Sanchez, male    DOB: 08-Dec-1950, 60 y.o.   MRN: [id number]  HPI DIARRHEA: Onset: 1 week ago   Time course: intermittent over past week   Description: variable , loose - watery   Number of stools per day: up to 6 on 8/30; last diarrhea 9/2  Previous treatment: yes, Pepto Bismol w/o help; clear liquids  Vomiting: no  Abdominal Pain: yes, cramping  Weight loss: no  Decreased urine output: no  Lightheadedness: no  Recent travel history: no    Sick contacts: no    Suspicious food or water: no  Change in diet: no Red Flags Fever: no  Bloody stools: no  Recent antibiotics: no  Immuncompromised: no   Past medical history: No history of colonoscopy. (SOC reviewed)   Family/ Personal PMH: No history ulcers, colitis, polyps,or cancer       Review of Systems FBS 90-108; no hypoglycemia. He is not on metformin. Flatus especially foul     Objective:   Physical Exam General appearance is one of good health and nourishment w/o distress.  Eyes: No conjunctival inflammation or scleral icterus is present.  Oral exam: Dental hygiene is good; lips and gums are healthy appearing.There is no oropharyngeal erythema or exudate noted.  Neck:  No deformities, thyromegaly, masses, or tenderness noted.   Heart:  Normal rate and regular rhythm. S1 and S2 normal without gallop, murmur, click, rub or other extra sounds     Lungs:Chest clear to auscultation; no wheezes, rhonchi,rales ,or rubs present.No increased work of breathing.   Abdomen: bowel sounds normal, soft and non-tender without masses, organomegaly or hernias noted.  No guarding or rebound   Skin:Warm & dry.  Intact without suspicious lesions or rashes ; no jaundice or tenting Neuro: deep tendon reflexes were normal; strength & tone normal .   Lymphatic: No lymphadenopathy is noted about the head, neck, axilla  areas.              Assessment & Plan:  #1 mild changes with loose to  diarrheal stool, now resolved  #2 residual abdominal cramping  #3 diabetes, well controlled  Plan: Levsin SL will be recommended. Course of Align  will also be initiated.

## 2011-06-18 ENCOUNTER — Other Ambulatory Visit: Payer: Self-pay | Admitting: Internal Medicine

## 2011-06-18 DIAGNOSIS — E119 Type 2 diabetes mellitus without complications: Secondary | ICD-10-CM

## 2011-06-19 ENCOUNTER — Other Ambulatory Visit (INDEPENDENT_AMBULATORY_CARE_PROVIDER_SITE_OTHER): Payer: 59

## 2011-06-19 DIAGNOSIS — E119 Type 2 diabetes mellitus without complications: Secondary | ICD-10-CM

## 2011-06-19 NOTE — Progress Notes (Signed)
Labs only

## 2011-06-25 ENCOUNTER — Encounter: Payer: Self-pay | Admitting: Internal Medicine

## 2011-06-26 ENCOUNTER — Encounter: Payer: Self-pay | Admitting: Internal Medicine

## 2011-06-26 ENCOUNTER — Ambulatory Visit (INDEPENDENT_AMBULATORY_CARE_PROVIDER_SITE_OTHER): Payer: 59 | Admitting: Internal Medicine

## 2011-06-26 DIAGNOSIS — E119 Type 2 diabetes mellitus without complications: Secondary | ICD-10-CM

## 2011-06-26 NOTE — Progress Notes (Signed)
Subjective:    Patient ID: Seth Sanchez, male    DOB: 1950-09-27, 60 y.o.   MRN: [id number]  HPI Diabetes status assessment: Fasting or morning glucose range:  80-104 or average :  90  . Highest glucose 2 hours after any meal:  < 174(after pizza) ; usually < 150. Hypoglycemia :  no .                                                     Excess thirst;hunger; urination:  no.                                  Lightheadedness with standing:  no. Chest pain; Palpitations ;  Pain in  calves with walking:  no .                                                                                                                                Non healing skin  ulcers or sores,especially over the feet:  no. Numbness or tingling or burning in feet : no .                                                                                                                                              Significant change in  Weight : stable. Vision changes : no  .                                                                    Exercise : 60 min 3X/ week or > . Nutrition/diet:  Low carb/ fat. Medication compliance : off DM meds. Eye exam : 6 weeks ago, no retinopathy. Foot care : no.  A1c/ urine microalbumin monitor:  A1c was 8.8% on 09/21 /2011,an average sugar of 206 with a long-term risk of 76%. Over the last 4 months his A1c's  have ranged from 5.9 now down to 5.8%. This would be  average of 120 and risk of 16% . Urine microalbumin was 3.1 four  months ago; it is now normal at 0.6 .    Review of Systems     Objective:   Physical Exam he appears healthy and well-nourished  Chest is clear with no increased work of breathing  As a slow rhythm with a soft S4 with slight slurring.  He has no organomegaly or masses or abdominal bruits.  All pulses are intact although the pedal pulses are slightly decreased.  He has no ischemic changes of extremities; nail health is good  Light touch is normal over the  feet.        Assessment & Plan:  #1 uncontrolled diabetes has been reversed  with nutrition and exercise.Mild urine microalbumin elevation has resolved. By history he has no retinopathy.  Plan: He is asked to continue this excellent program of prevention. The diagnosis of diabetes will be removed. A1c should be monitored every 6 months x1 year and then annually thereafter if he remains at this level.

## 2011-06-26 NOTE — Patient Instructions (Signed)
You have a reversed the uncontrolled diabetes; A1c and urine microalbumin are now normal. Monitor these every 6 months x1 year and then annually thereafter. Continue the excellent preventive program

## 2011-07-07 ENCOUNTER — Emergency Department (HOSPITAL_BASED_OUTPATIENT_CLINIC_OR_DEPARTMENT_OTHER)
Admission: EM | Admit: 2011-07-07 | Discharge: 2011-07-07 | Disposition: A | Discharge status: Home / Self Care | Payer: 59 | Attending: Emergency Medicine | Admitting: Emergency Medicine

## 2011-07-07 ENCOUNTER — Encounter (HOSPITAL_BASED_OUTPATIENT_CLINIC_OR_DEPARTMENT_OTHER): Payer: Self-pay | Admitting: *Deleted

## 2011-07-07 ENCOUNTER — Emergency Department (INDEPENDENT_AMBULATORY_CARE_PROVIDER_SITE_OTHER): Payer: 59

## 2011-07-07 DIAGNOSIS — W401XXA Explosion of explosive gases, initial encounter: Secondary | ICD-10-CM

## 2011-07-07 DIAGNOSIS — Z951 Presence of aortocoronary bypass graft: Secondary | ICD-10-CM | POA: Insufficient documentation

## 2011-07-07 DIAGNOSIS — I252 Old myocardial infarction: Secondary | ICD-10-CM | POA: Insufficient documentation

## 2011-07-07 DIAGNOSIS — Y92009 Unspecified place in unspecified non-institutional (private) residence as the place of occurrence of the external cause: Secondary | ICD-10-CM | POA: Insufficient documentation

## 2011-07-07 DIAGNOSIS — I2510 Atherosclerotic heart disease of native coronary artery without angina pectoris: Secondary | ICD-10-CM | POA: Insufficient documentation

## 2011-07-07 DIAGNOSIS — X04XXXA Exposure to ignition of highly flammable material, initial encounter: Secondary | ICD-10-CM | POA: Insufficient documentation

## 2011-07-07 DIAGNOSIS — T22219A Burn of second degree of unspecified forearm, initial encounter: Secondary | ICD-10-CM | POA: Insufficient documentation

## 2011-07-07 DIAGNOSIS — T2020XA Burn of second degree of head, face, and neck, unspecified site, initial encounter: Secondary | ICD-10-CM | POA: Insufficient documentation

## 2011-07-07 DIAGNOSIS — T2000XA Burn of unspecified degree of head, face, and neck, unspecified site, initial encounter: Secondary | ICD-10-CM

## 2011-07-07 DIAGNOSIS — E785 Hyperlipidemia, unspecified: Secondary | ICD-10-CM | POA: Insufficient documentation

## 2011-07-07 DIAGNOSIS — IMO0002 Reserved for concepts with insufficient information to code with codable children: Secondary | ICD-10-CM

## 2011-07-07 MED ORDER — OXYCODONE-ACETAMINOPHEN 5-325 MG PO TABS: 2.0000 | ORAL_TABLET | ORAL | Status: AC | PRN

## 2011-07-07 MED ORDER — OXYCODONE-ACETAMINOPHEN 5-325 MG PO TABS
2.0000 | ORAL_TABLET | Freq: Once | ORAL | Status: AC
  Administered 2011-07-07: 2 via ORAL
  Filled 2011-07-07: qty 2

## 2011-07-07 MED ORDER — SILVER SULFADIAZINE 1 % EX CREA
TOPICAL_CREAM | Freq: Once | CUTANEOUS | Status: AC
  Administered 2011-07-07: 1 via TOPICAL
  Filled 2011-07-07: qty 85
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
## 2011-07-11 ENCOUNTER — Telehealth: Payer: Self-pay | Admitting: *Deleted

## 2011-07-11 MED ORDER — SILVER SULFADIAZINE 1 % EX CREA: TOPICAL_CREAM | Freq: Every day | CUTANEOUS | Status: AC
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
## 2011-07-11 NOTE — Telephone Encounter (Signed)
Pt aware rx sent to pharmacy.
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
## 2011-07-11 NOTE — Telephone Encounter (Signed)
Refill Sivadene

## 2011-09-24 HISTORY — PX: CORONARY ARTERY BYPASS GRAFT: SHX141

## 2011-12-07 IMAGING — CR DG CHEST 2V
2 series · 2 of 2 positions shown · non-contrast
Comparison: 11/04/2010

CLINICAL DATA: Pre CABG

CHEST - 2 VIEW

[w chest pa]
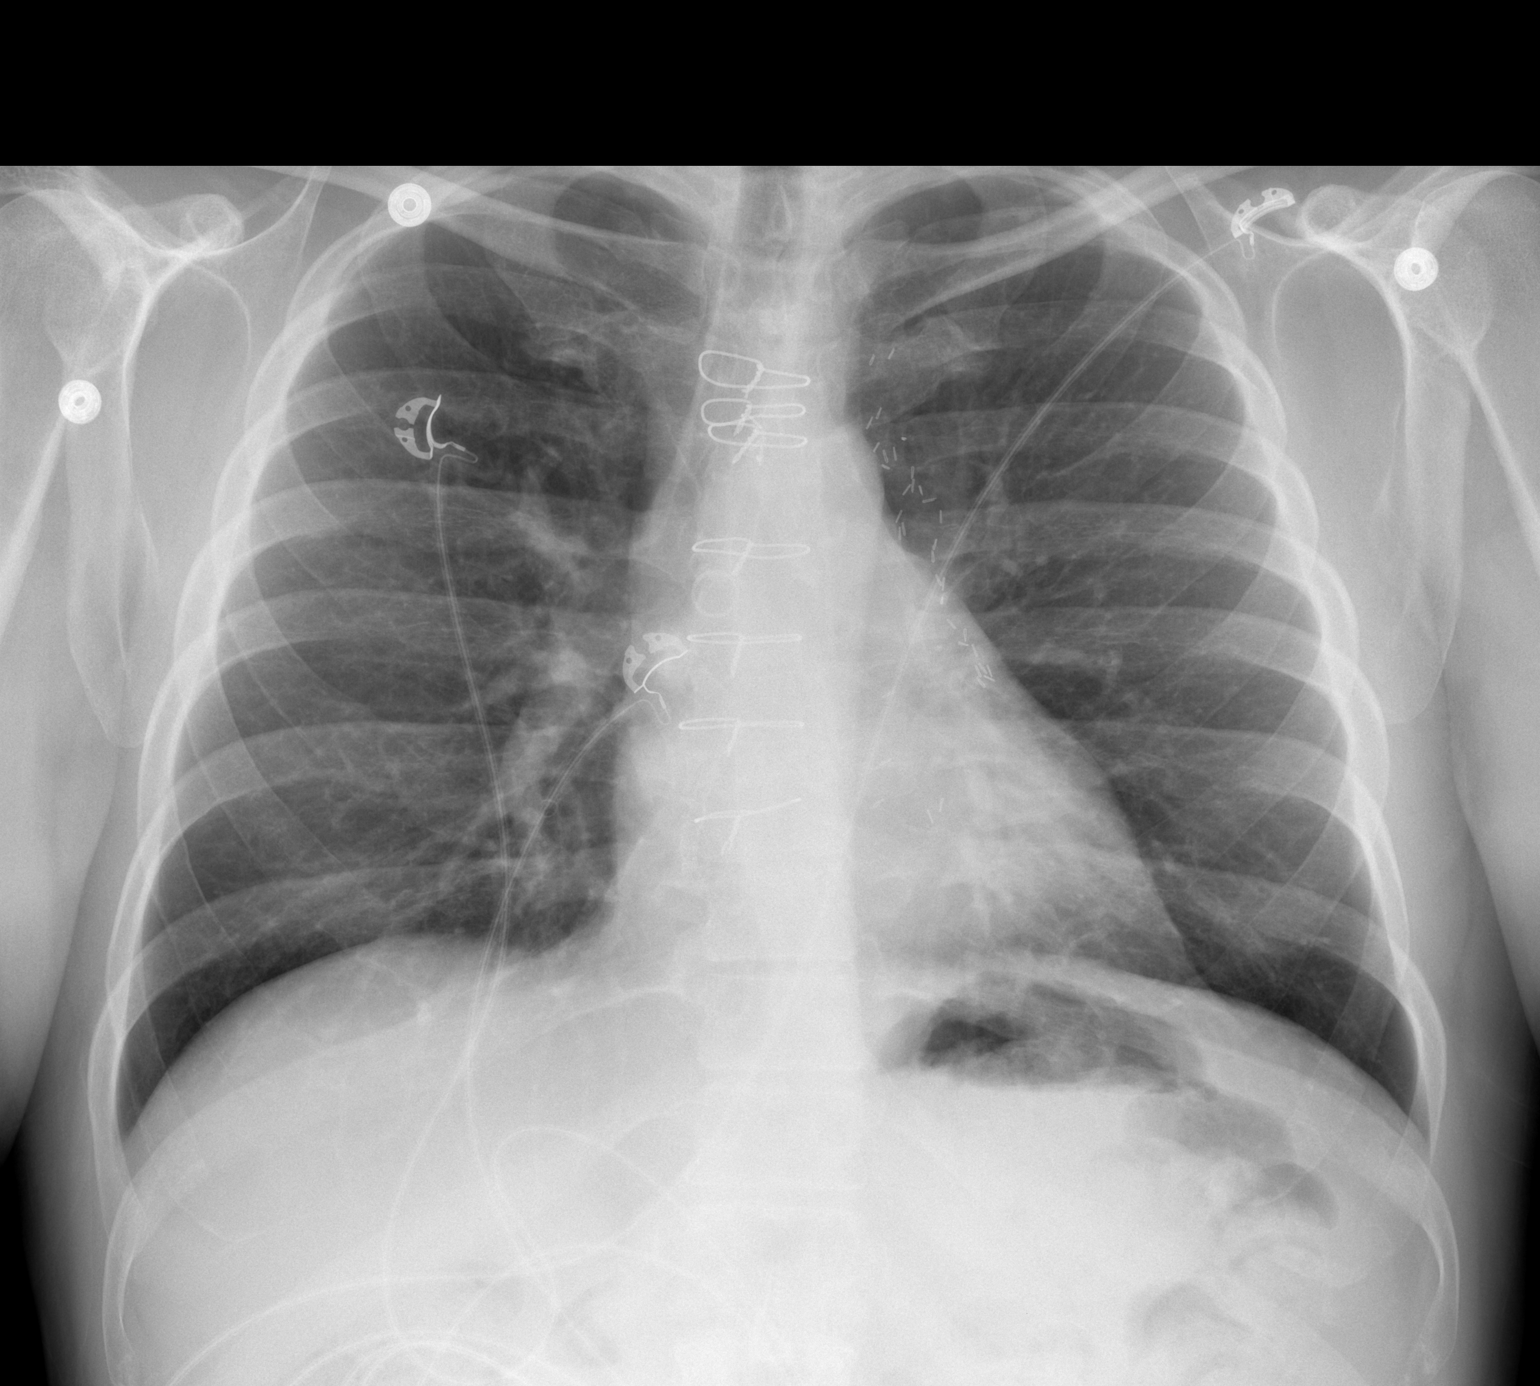

[w chest lat]
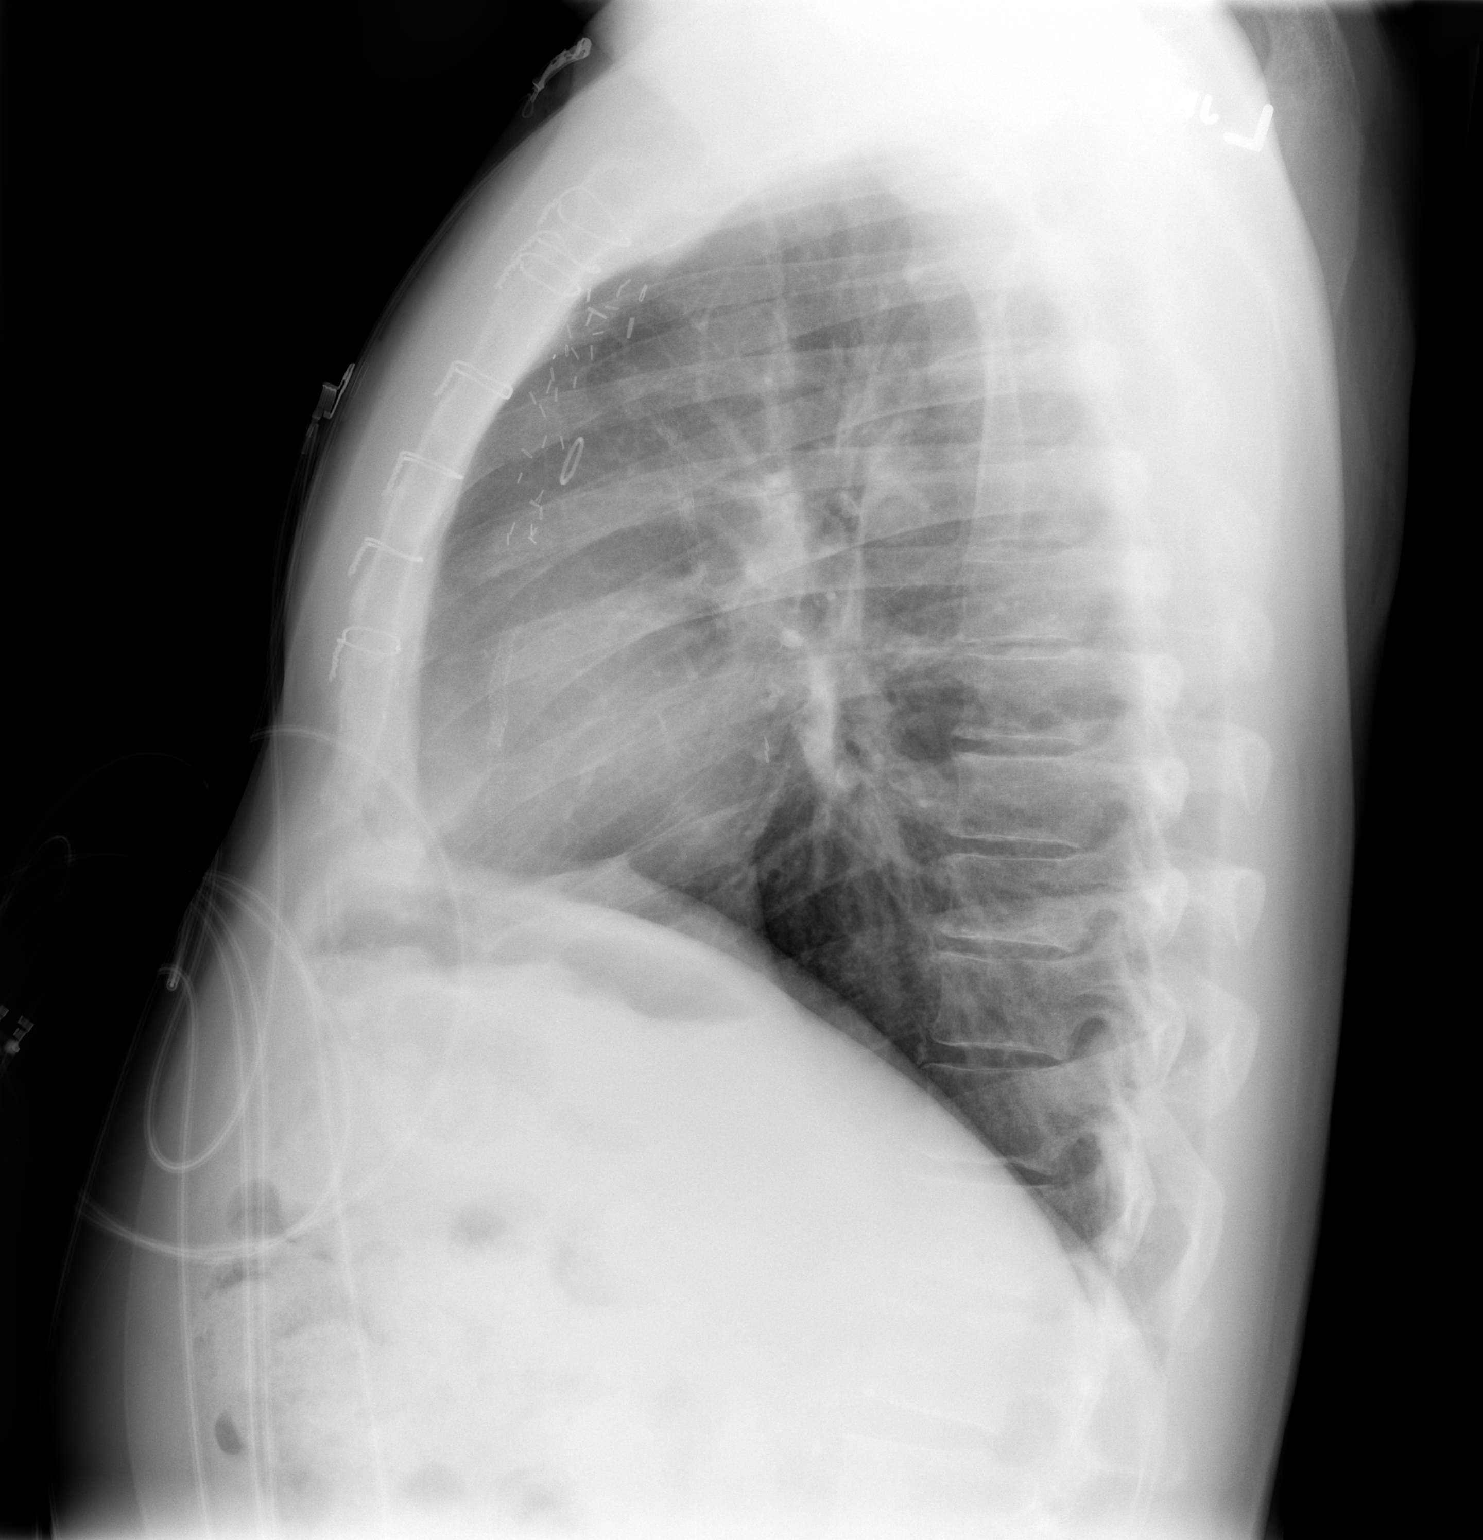

[2 of 2 positions shown; findings below may reference images not displayed]

FINDINGS: Evidence of prior CABG noted.  Heart size is normal.  The
lungs are clear.  No pleural effusion.  No acute osseous finding.
IMPRESSION: No acute cardiopulmonary process.

## 2011-12-08 IMAGING — CR DG CHEST 1V PORT
1 series · 1 of 1 positions shown · non-contrast
Comparison: 11/11/2010

CLINICAL DATA: Status post coronary artery bypass

PORTABLE CHEST - 1 VIEW

[view not recorded]
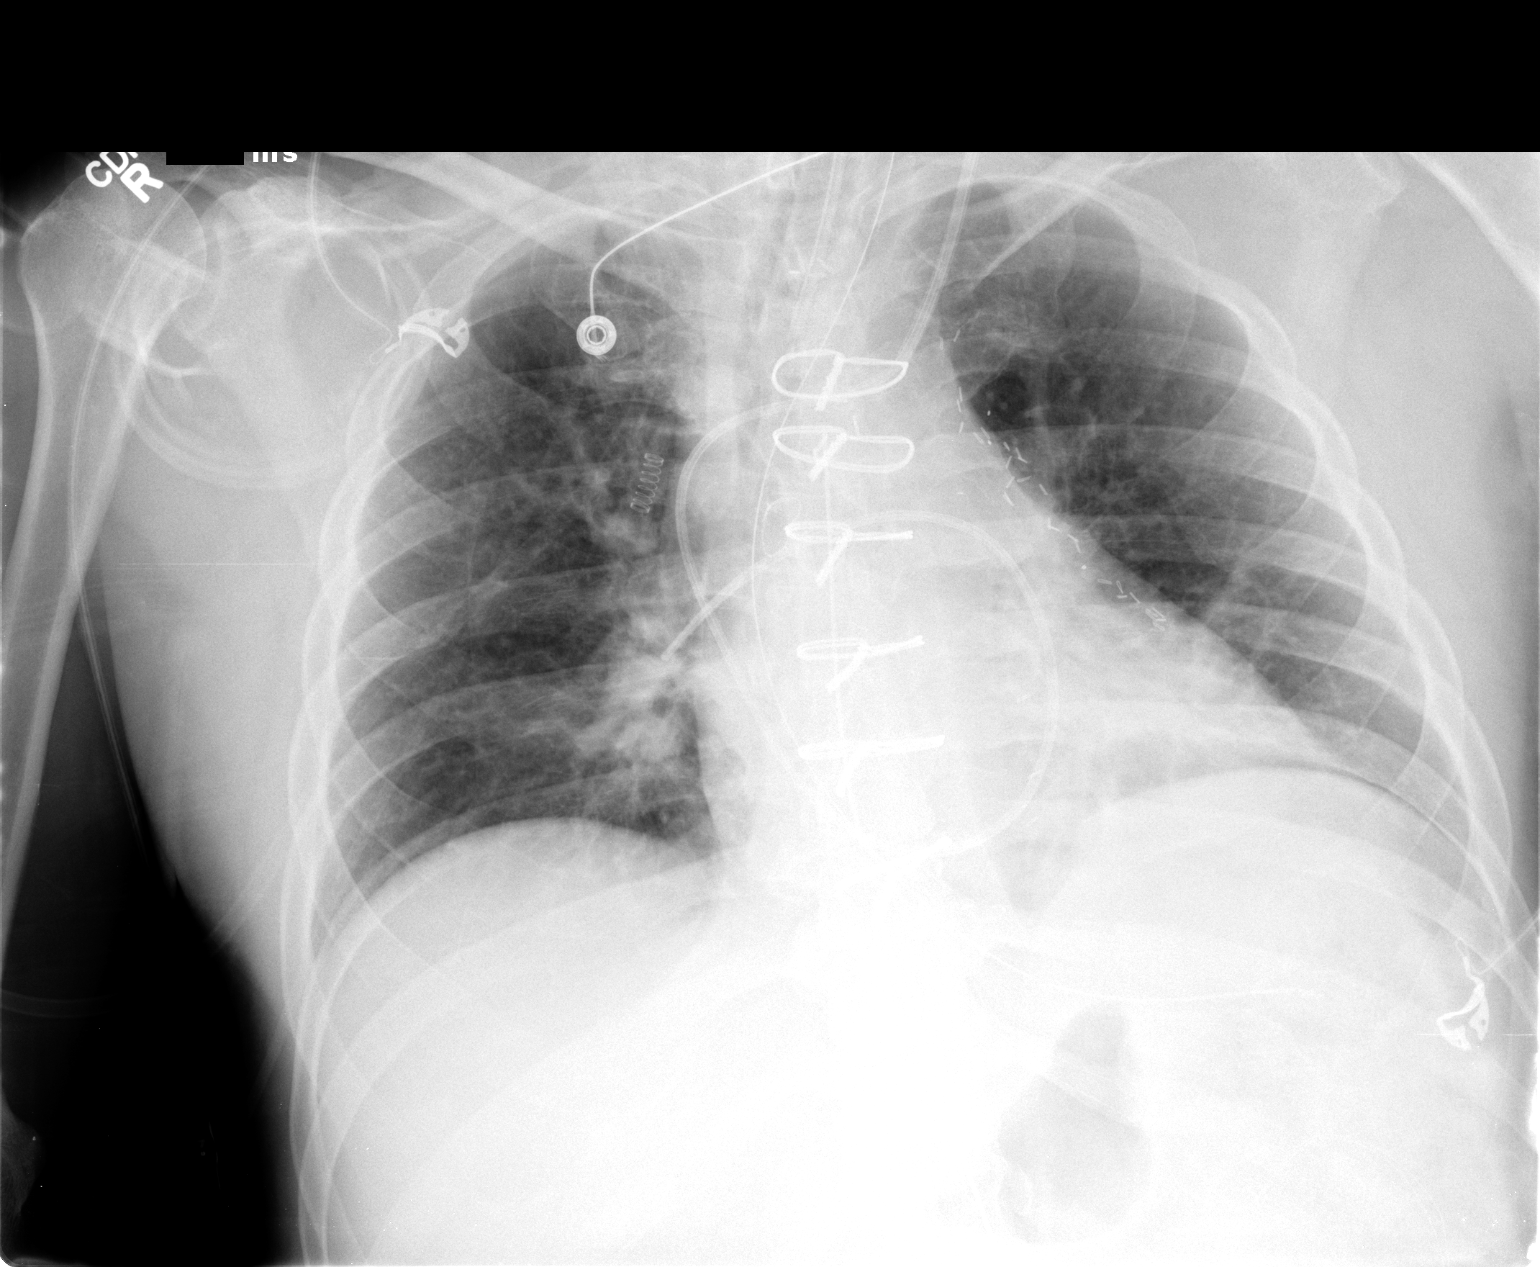

[1 of 1 positions shown; findings below may reference images not displayed]

FINDINGS: Postoperative changes are now present.  Endotracheal tube
tip is 4.9 cm from the carina.  NG tube tip is in the fundus of the
stomach.  Mediastinal and chest tubes are present.  Left internal
jugular vein introducer and Swan-Ganz catheter is present with its
tip in the right lower lobe pulmonary artery branch in the region
of the right hilum.  Low volumes.  No pulmonary edema and no
pneumothorax.
IMPRESSION: Postop chest.  Low volumes.  No edema or pneumothorax.

## 2011-12-09 IMAGING — CR DG CHEST 1V PORT
1 series · 1 of 1 positions shown · non-contrast
Comparison: 11/12/2010

CLINICAL DATA: Post CABG

PORTABLE CHEST - 1 VIEW

[AP]
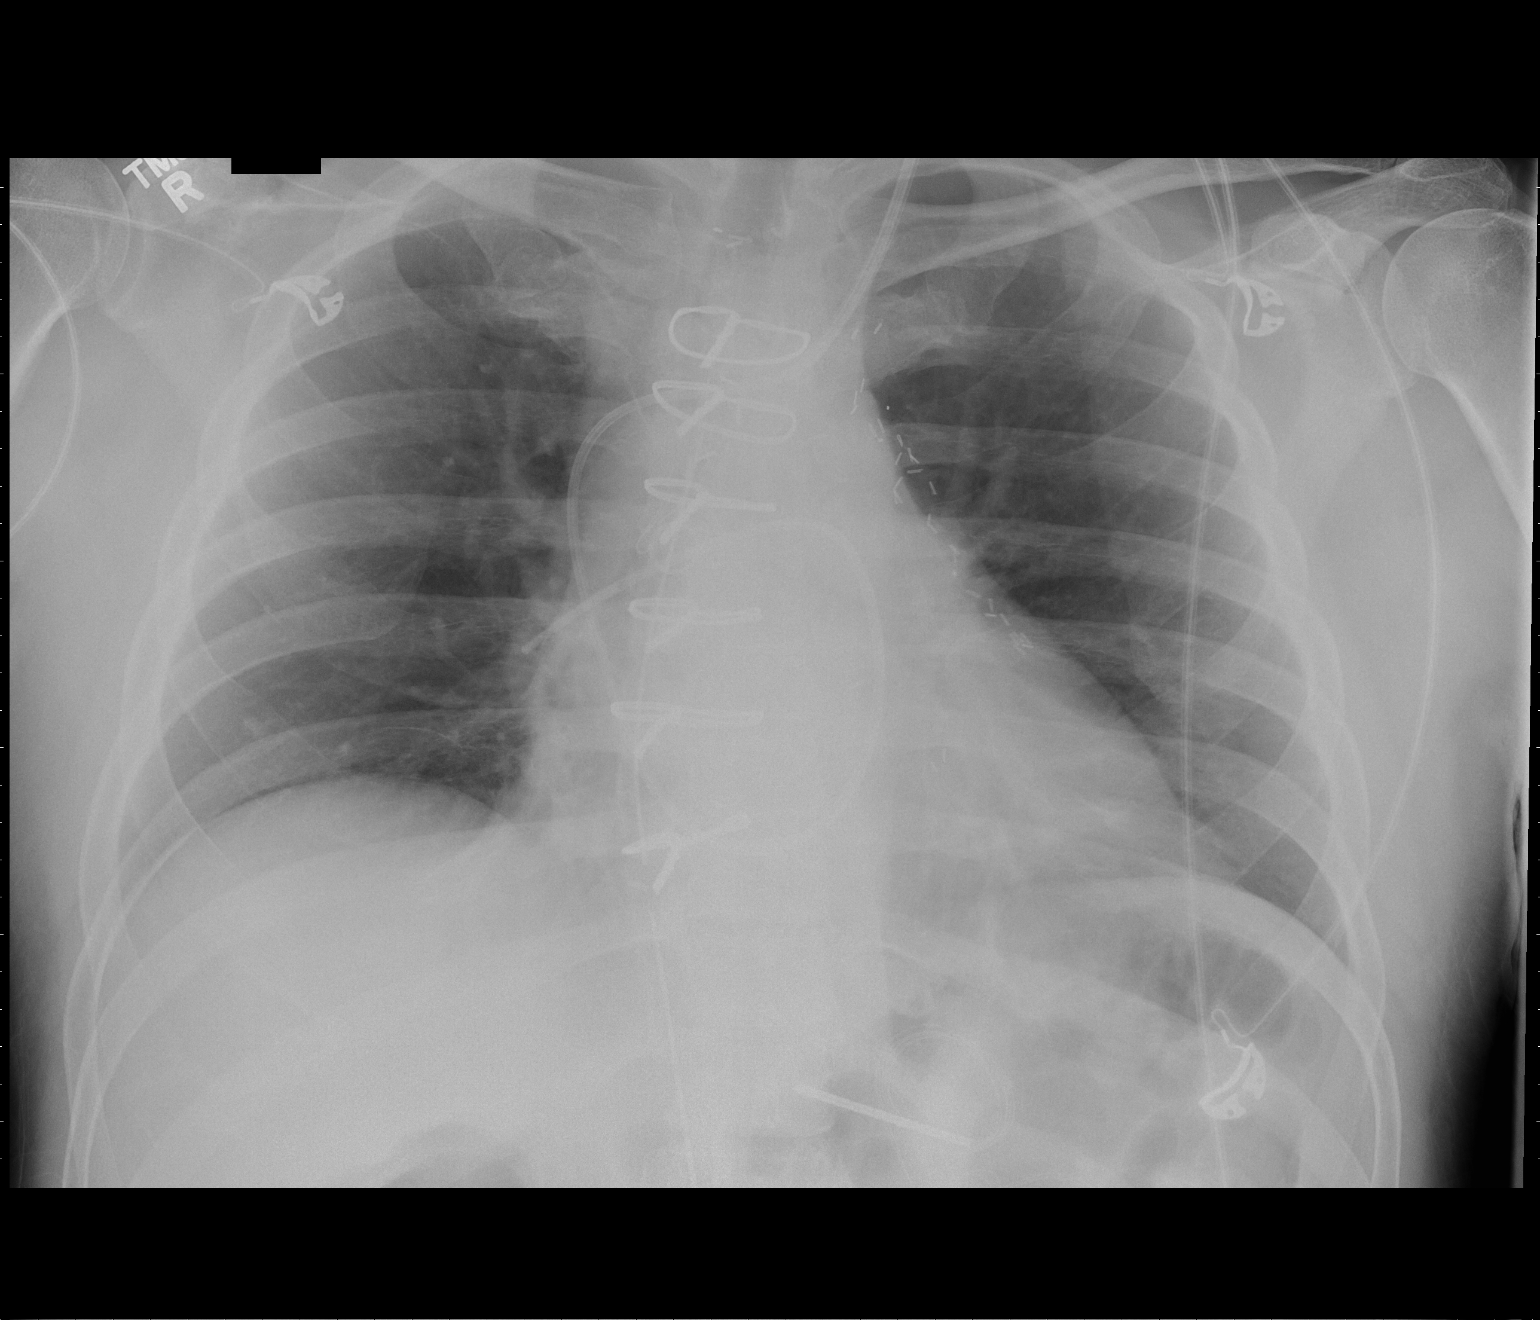

[1 of 1 positions shown; findings below may reference images not displayed]

FINDINGS: ET tube and NG tube removed. Heart size normal without
congestive heart failure.  No pneumothorax.  Minimal left lower
lobe atelectasis.
IMPRESSION: Satisfactory postoperative chest x-ray - no acute findings.

## 2011-12-10 IMAGING — CR DG CHEST 2V
2 series · 2 of 2 positions shown · non-contrast
Comparison: 11/13/2010

CLINICAL DATA: Open heart surgery 3 days ago.

CHEST - 2 VIEW

[w chest pa]
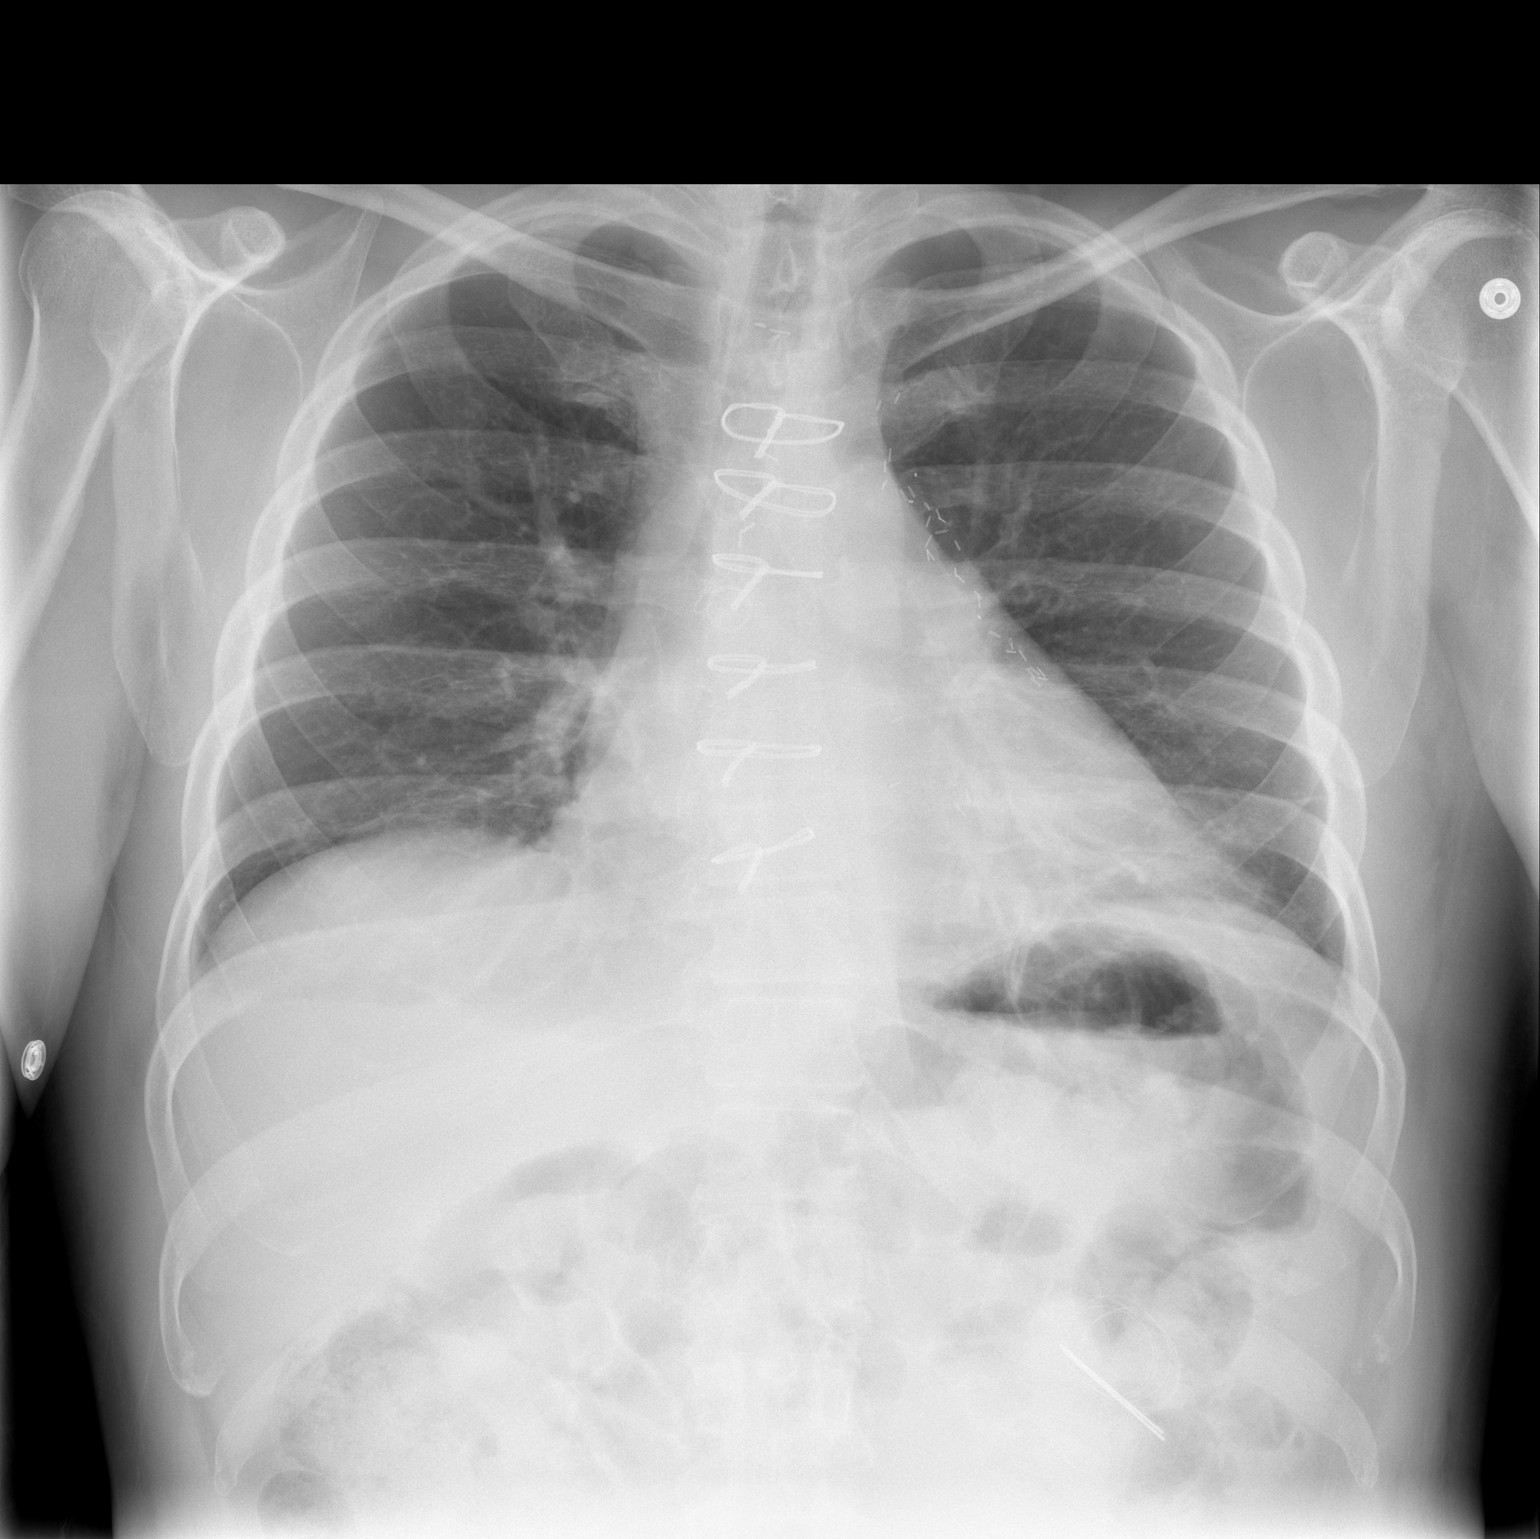

[w chest lat]
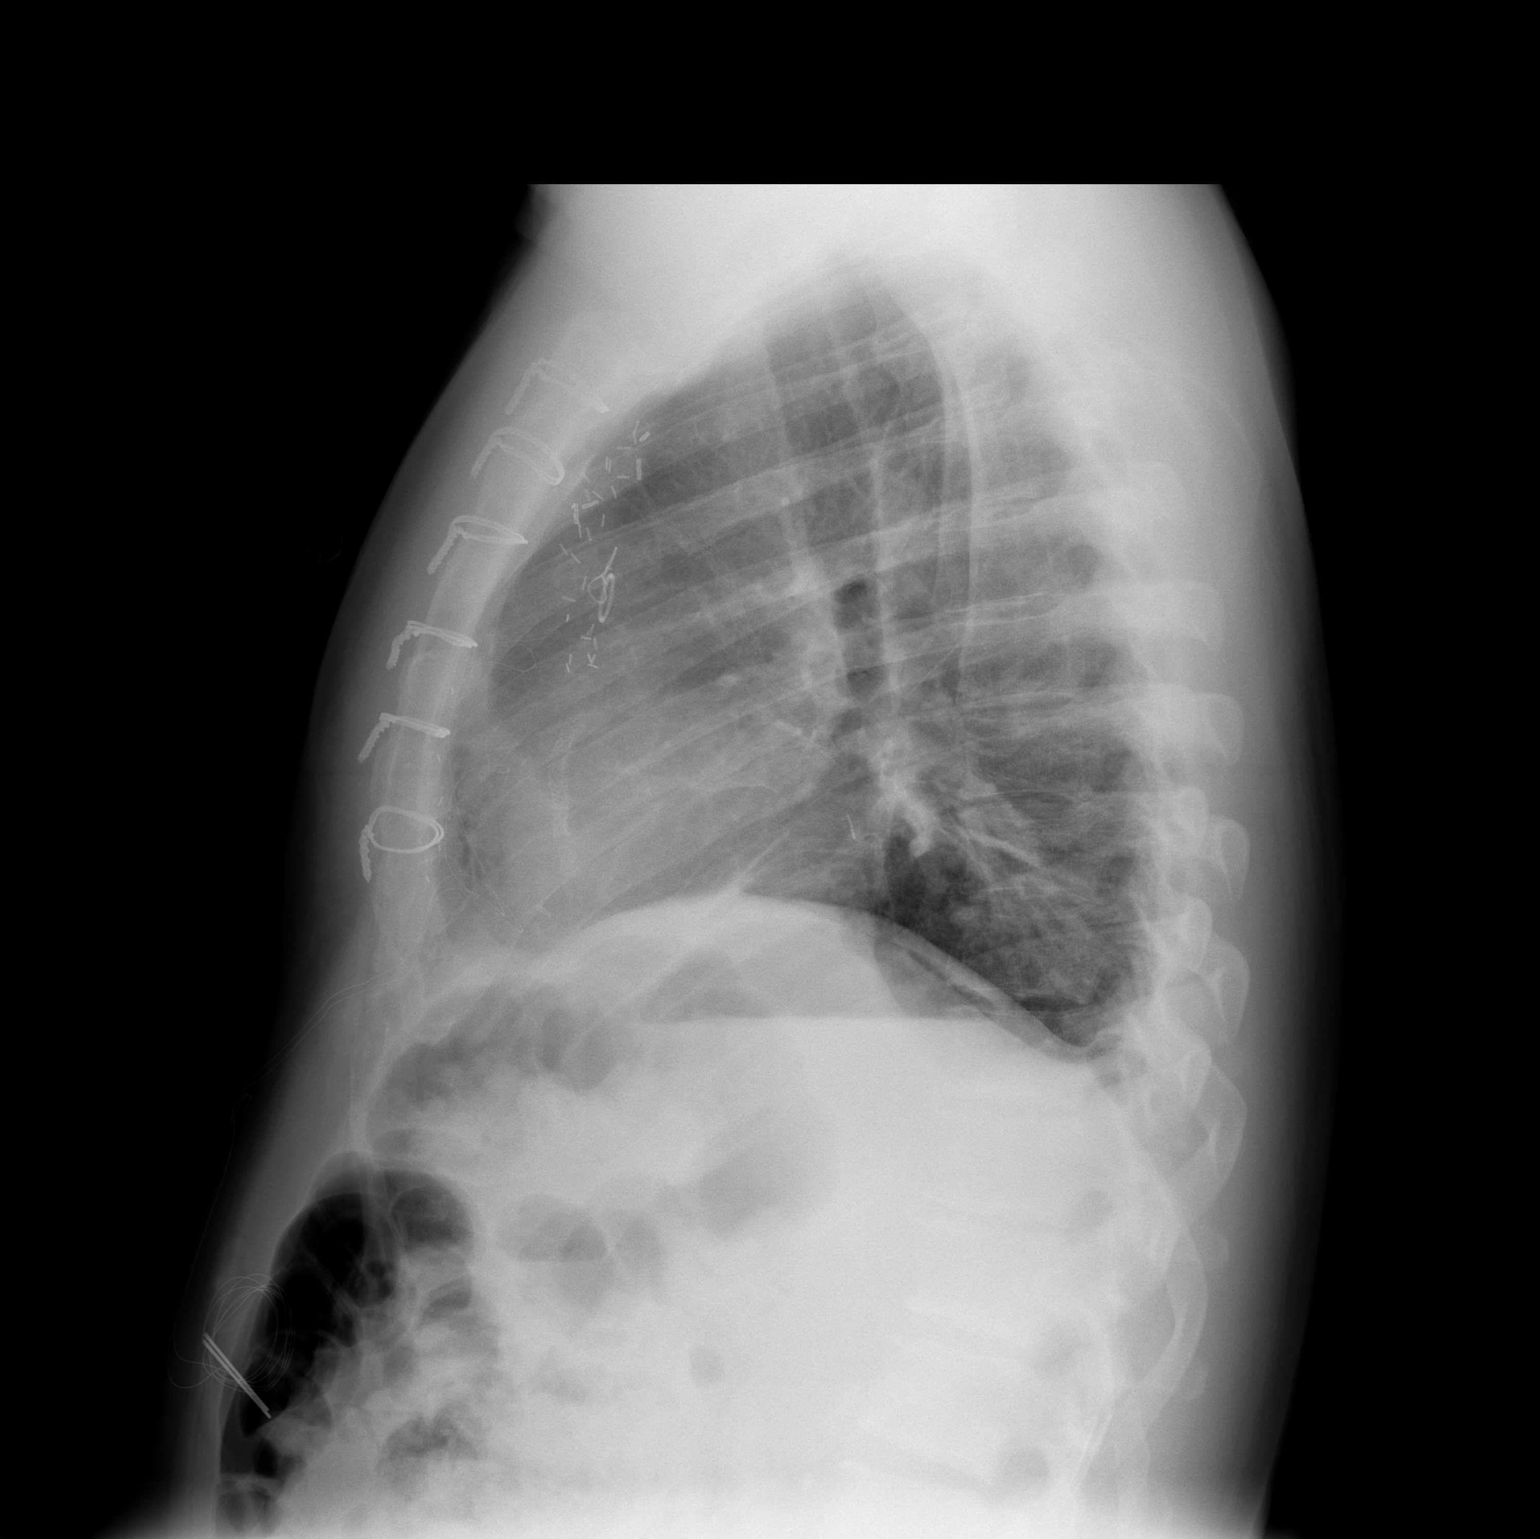

[2 of 2 positions shown; findings below may reference images not displayed]

FINDINGS: Trachea is midline.  Heart size stable.  All support
apparatus has been removed, with the exception of epicardial pacer
wires.  Six intact sternotomy wires are stable in position.  Lungs
are somewhat low in volume with minimal bibasilar atelectasis.  No
edema.  No pneumothorax.  No pleural fluid.
IMPRESSION: Minimal bibasilar atelectasis.

## 2012-10-04 ENCOUNTER — Encounter (HOSPITAL_COMMUNITY): Payer: Self-pay | Admitting: *Deleted

## 2012-10-04 ENCOUNTER — Emergency Department (HOSPITAL_COMMUNITY)
Admission: EM | Admit: 2012-10-04 | Discharge: 2012-10-04 | Disposition: A | Discharge status: Home / Self Care | Payer: BC Managed Care – PPO | Attending: Emergency Medicine | Admitting: Emergency Medicine

## 2012-10-04 ENCOUNTER — Emergency Department (HOSPITAL_COMMUNITY): Payer: BC Managed Care – PPO

## 2012-10-04 DIAGNOSIS — Z8709 Personal history of other diseases of the respiratory system: Secondary | ICD-10-CM | POA: Insufficient documentation

## 2012-10-04 DIAGNOSIS — Z7982 Long term (current) use of aspirin: Secondary | ICD-10-CM | POA: Insufficient documentation

## 2012-10-04 DIAGNOSIS — Z87891 Personal history of nicotine dependence: Secondary | ICD-10-CM | POA: Insufficient documentation

## 2012-10-04 DIAGNOSIS — I252 Old myocardial infarction: Secondary | ICD-10-CM | POA: Insufficient documentation

## 2012-10-04 DIAGNOSIS — R079 Chest pain, unspecified: Secondary | ICD-10-CM

## 2012-10-04 DIAGNOSIS — I2510 Atherosclerotic heart disease of native coronary artery without angina pectoris: Secondary | ICD-10-CM | POA: Insufficient documentation

## 2012-10-04 DIAGNOSIS — Z79899 Other long term (current) drug therapy: Secondary | ICD-10-CM | POA: Insufficient documentation

## 2012-10-04 DIAGNOSIS — R0789 Other chest pain: Secondary | ICD-10-CM | POA: Insufficient documentation

## 2012-10-04 DIAGNOSIS — F3289 Other specified depressive episodes: Secondary | ICD-10-CM | POA: Insufficient documentation

## 2012-10-04 DIAGNOSIS — E785 Hyperlipidemia, unspecified: Secondary | ICD-10-CM | POA: Insufficient documentation

## 2012-10-04 DIAGNOSIS — F329 Major depressive disorder, single episode, unspecified: Secondary | ICD-10-CM | POA: Insufficient documentation

## 2012-10-04 DIAGNOSIS — E559 Vitamin D deficiency, unspecified: Secondary | ICD-10-CM | POA: Insufficient documentation

## 2012-10-04 DIAGNOSIS — Z951 Presence of aortocoronary bypass graft: Secondary | ICD-10-CM | POA: Insufficient documentation

## 2012-10-04 LAB — CBC WITH DIFFERENTIAL/PLATELET
Basophils Absolute: 0 10*3/uL (ref 0.0–0.1)
Basophils Relative: 1 % (ref 0–1)
Eosinophils Absolute: 0.3 10*3/uL (ref 0.0–0.7)
Eosinophils Relative: 5 % (ref 0–5)
HCT: 38.8 % — ABNORMAL LOW (ref 39.0–52.0)
Hemoglobin: 13.4 g/dL (ref 13.0–17.0)
Lymphocytes Relative: 30 % (ref 12–46)
Lymphs Abs: 1.7 10*3/uL (ref 0.7–4.0)
MCH: 29.4 pg (ref 26.0–34.0)
MCHC: 34.5 g/dL (ref 30.0–36.0)
MCV: 85.1 fL (ref 78.0–100.0)
Monocytes Absolute: 0.7 10*3/uL (ref 0.1–1.0)
Monocytes Relative: 13 % — ABNORMAL HIGH (ref 3–12)
Neutro Abs: 3 10*3/uL (ref 1.7–7.7)
Neutrophils Relative %: 52 % (ref 43–77)
Platelets: 198 10*3/uL (ref 150–400)
RBC: 4.56 MIL/uL (ref 4.22–5.81)
RDW: 12.3 % (ref 11.5–15.5)
WBC: 5.8 10*3/uL (ref 4.0–10.5)

## 2012-10-04 LAB — POCT I-STAT TROPONIN I
Troponin i, poc: 0.01 ng/mL (ref 0.00–0.08)
Troponin i, poc: 0.02 ng/mL (ref 0.00–0.08)

## 2012-10-04 LAB — BASIC METABOLIC PANEL
BUN: 17 mg/dL (ref 6–23)
CO2: 24 mEq/L (ref 19–32)
Calcium: 9.2 mg/dL (ref 8.4–10.5)
Chloride: 104 mEq/L (ref 96–112)
Creatinine, Ser: 0.98 mg/dL (ref 0.50–1.35)
GFR calc Af Amer: >90 mL/min (ref >90)
GFR calc non Af Amer: 88 mL/min — ABNORMAL LOW (ref >90)
Glucose, Bld: 92 mg/dL (ref 70–99)
Potassium: 3.8 mEq/L (ref 3.5–5.1)
Sodium: 138 mEq/L (ref 135–145)

## 2012-10-04 LAB — PRO B NATRIURETIC PEPTIDE: Pro B Natriuretic peptide (BNP): 94.8 pg/mL (ref 0–125)

## 2012-10-04 MED ORDER — METOPROLOL TARTRATE 50 MG PO TABS: 25.0000 mg | ORAL_TABLET | Freq: Two times a day (BID) | ORAL | Status: DC

## 2012-10-04 MED ORDER — ASPIRIN 325 MG PO TABS: 325.0000 mg | ORAL_TABLET | Freq: Once | ORAL | Status: DC

## 2012-10-04 MED ORDER — ISOSORBIDE MONONITRATE ER 30 MG PO TB24: 30.0000 mg | ORAL_TABLET | Freq: Every day | ORAL | Status: DC

## 2012-10-04 MED ORDER — NITROGLYCERIN 0.4 MG SL SUBL: 0.4000 mg | SUBLINGUAL_TABLET | SUBLINGUAL | Status: DC | PRN

## 2012-10-04 MED ORDER — ISOSORBIDE MONONITRATE ER 30 MG PO TB24
30.0000 mg | ORAL_TABLET | Freq: Once | ORAL | Status: AC
  Administered 2012-10-04: 30 mg via ORAL
  Filled 2012-10-04: qty 1

## 2012-10-04 MED ORDER — ASPIRIN 81 MG PO CHEW
CHEWABLE_TABLET | ORAL | Status: AC
  Administered 2012-10-04: 162 mg
  Filled 2012-10-04: qty 2

## 2012-10-04 NOTE — ED Notes (Signed)
Pt reports taking ASA 162 mg prior to arrival to ED

## 2012-10-04 NOTE — ED Notes (Signed)
Pt reports having mild chest discomfort all weekend that he thought was reflux, became more severe this afternoon. Having mid chest pressure and dizziness. Denies nausea or sob. Took ASA at home. ekg done at triage.

## 2012-10-04 NOTE — ED Provider Notes (Signed)
History     CSN: [id number]  Arrival date & time 10/04/12  1754   None     Chief Complaint  Patient presents with  . Chest Pain     HPI chief complaint: Chest pain. Onset: Yesterday. Location: Central chest. Not improved or worsened by anything. Quality:dull. Severity: Mild. Timing: Constant. Not worsened with exertion or deep breathing. For signs and symptoms to review of systems. For social history see nurse's notes. I have reviewed patient's past medical, past surgical, past social history as well as medications and allergies. For family history see below.  Past Medical History  Diagnosis Date  . MI (myocardial infarction)     diaphramatic- Dr. Daphene Jaeger  . Depression   . Vitamin D deficiency   . Diaphragmatic disorder     MI  . CAD (coronary artery disease)   . Dyslipidemia     Past Surgical History  Procedure Date  . Cardiac catheterization     x5  . Coronary artery bypass graft     R coronary artery stent  . Coronary artery bypass graft     Family History  Problem Relation Age of Onset  . Diabetes Mother   . Cancer Father     lung cancer    History  Substance Use Topics  . Smoking status: Former Games developer  . Smokeless tobacco: Former Neurosurgeon    Quit date: 09/23/2006  . Alcohol Use: No      Review of Systems  Constitutional: Negative for fever and chills.  HENT: Negative for neck pain and neck stiffness.   Eyes: Negative for visual disturbance.  Respiratory: Negative for cough, chest tightness and shortness of breath.   Cardiovascular: Positive for chest pain. Negative for palpitations and leg swelling.  Gastrointestinal: Negative for nausea, vomiting, abdominal pain, diarrhea, constipation, blood in stool and abdominal distention.  Genitourinary: Negative for dysuria, urgency, hematuria and difficulty urinating.  Musculoskeletal: Negative for back pain and gait problem.  Skin: Negative for rash.  Neurological: Negative for dizziness, tremors, seizures,  syncope, facial asymmetry, speech difficulty, weakness, light-headedness, numbness and headaches.  Hematological: Negative for adenopathy. Does not bruise/bleed easily.  Psychiatric/Behavioral: Negative for confusion.    Allergies  Review of patient's allergies indicates no known allergies.  Home Medications   Current Outpatient Rx  Name  Route  Sig  Dispense  Refill  . ASPIRIN 325 MG PO TBEC   Oral   Take 650 mg by mouth once.         . ASPIRIN EC 325 MG PO TBEC   Oral   Take 325 mg by mouth daily.           . ATORVASTATIN CALCIUM 20 MG PO TABS   Oral   Take 20 mg by mouth daily.           [name] VITAMIN D-3 5000 UNITS PO TABS   Oral   Take 1 capsule by mouth daily.          . COQ10 100 MG PO CAPS   Oral   Take 1 capsule by mouth daily.          [name] FINASTERIDE 1 MG PO TABS   Oral   Take 1 mg by mouth daily.           . ISOSORBIDE DINITRATE 30 MG PO TABS   Oral   Take 30 mg by mouth at bedtime.          [name] LAMOTRIGINE 200 MG PO TABS  Oral   Take 200 mg by mouth daily.           [name] NIACIN ER (ANTIHYPERLIPIDEMIC) 1000 MG PO TBCR   Oral   Take 1,000 mg by mouth at bedtime.           [name] NITROGLYCERIN 0.4 MG SL SUBL   Sublingual   Place 0.4 mg under the tongue every 5 (five) minutes as needed. For chest pain. For up to 3 doses         . GLUCOSE BLOOD VI STRP   Other   1 each by Other route daily. Use as instructed          . ONETOUCH LANCETS MISC   Does not apply   by Does not apply route daily.             BP 171/96  Pulse 72  Temp 98 F (36.7 C) (Oral)  Resp 18  SpO2 98%  Physical Exam  Constitutional: He is oriented to person, place, and time. He appears well-developed and well-nourished. No distress.  HENT:  Head: Normocephalic.  Eyes: Conjunctivae normal are normal.  Neck: Normal range of motion. Neck supple. No JVD present.  Cardiovascular: Normal rate, regular rhythm, normal heart sounds and intact distal pulses.   No  murmur heard. Pulmonary/Chest: Effort normal and breath sounds normal. No respiratory distress. He has no wheezes. He has no rales. He exhibits no tenderness.  Abdominal: Soft. Bowel sounds are normal. He exhibits no distension and no mass. There is no tenderness. There is no rebound and no guarding.  Musculoskeletal: Normal range of motion. He exhibits no edema and no tenderness.  Neurological: He is alert and oriented to person, place, and time.  Skin: Skin is warm and dry. He is not diaphoretic.  Psychiatric: He has a normal mood and affect.    ED Course  Procedures (including critical care time)  Labs Reviewed  CBC WITH DIFFERENTIAL - Abnormal; Notable for the following:    HCT 38.8 (*)     Monocytes Relative 13 (*)     All other components within normal limits  BASIC METABOLIC PANEL - Abnormal; Notable for the following:    GFR calc non Af Amer 88 (*)     All other components within normal limits  PRO B NATRIURETIC PEPTIDE  POCT I-STAT TROPONIN I  POCT I-STAT TROPONIN I   Dg Chest 2 View  10/04/2012  *RADIOLOGY REPORT*  Clinical Data: Shortness of breath, chest pain, cough, congestion.  CHEST - 2 VIEW  Comparison: 07/07/2011  Findings: Previous CABG.  Lungs clear.  Heart size normal.  No effusion.  IMPRESSION:  No acute disease post CABG.   Original Report Authenticated By: D. Andria Rhein, MD      1. Chest pain      EKG reviewed and interpreted: Normal sinus rhythm rate 67. Indeterminate axis. Normal intervals. No T wave abnormalities. No ST segment changes. Normal QT interval. MDM  Patient is a well-appearing 62 year old male with a significant cardiac history including MI, numerous cardiac stents and a one-vessel bypass 2 years ago who is a patient of Dr. Tresa Endo with [facility] heart [facility] vascular presenting with constant mild nonpleuritic, nonexertional, nonreproducible chest pain since yesterday. Vitals able. Afebrile. Mildly hypertensive. Compliant with medications. EKG  unremarkable. Work up pending at this time.   Initial labs unremarkable. Case discussed with Dr. Tresa Endo on call for [facility] heart and vascular who agrees with our plan for a delta troponin around 11:00  PM and if negative start the patient on Imdur metoprolol and to refill his prescription for sublingual nitroglycerin and have patient followup in clinic in the next few days. Patient's repeat troponin was negative so we'll plan for discharge and cardiology followup this week.        Consuello Masse, MD 10/04/12 2308

## 2012-10-04 NOTE — ED Notes (Addendum)
Pt presents to ED w/constant mid chest pressure radiating up into his neck and mild SOB x2 days. Pt reports increase pressure approx 1 hour ago, pt sts "I just got a funny feeling all over." Pt had a cardiac cath in 2012 and has maybe 6 stents. Pt denies abd/back pain, N/V, diaphoresis, cough, congestion, or fever. Pt has a hx of a MI and feels these symptoms are identical to his MI.

## 2012-10-06 ENCOUNTER — Other Ambulatory Visit (HOSPITAL_COMMUNITY): Payer: Self-pay | Admitting: Cardiovascular Disease

## 2012-10-06 DIAGNOSIS — R079 Chest pain, unspecified: Secondary | ICD-10-CM

## 2012-10-07 NOTE — ED Provider Notes (Signed)
I saw and evaluated the patient, reviewed the resident's note and I agree with the findings and plan.  [age]-year-old male with atypical chest pain. EKG shows a sinus rhythm with no acute ST changes. Patient has a known history of coronary artery disease, but his chest pain is atypical for ACS. Dr. troponin is normal. I feel he is safe for discharge. Outpatient followup with his cardiologist.  Raeford Razor, MD 10/07/12 1205

## 2012-10-15 ENCOUNTER — Ambulatory Visit (HOSPITAL_COMMUNITY)
Admission: RE | Admit: 2012-10-15 | Discharge: 2012-10-15 | Disposition: A | Discharge status: Home / Self Care | Payer: BC Managed Care – PPO | Source: Ambulatory Visit | Attending: Cardiovascular Disease | Admitting: Cardiovascular Disease

## 2012-10-15 DIAGNOSIS — R079 Chest pain, unspecified: Secondary | ICD-10-CM

## 2012-10-15 DIAGNOSIS — I10 Essential (primary) hypertension: Secondary | ICD-10-CM | POA: Insufficient documentation

## 2012-10-15 DIAGNOSIS — E119 Type 2 diabetes mellitus without complications: Secondary | ICD-10-CM | POA: Insufficient documentation

## 2012-10-15 MED ORDER — REGADENOSON 0.4 MG/5ML IV SOLN
0.4000 mg | Freq: Once | INTRAVENOUS | Status: AC
  Administered 2012-10-15: 0.4 mg via INTRAVENOUS

## 2012-10-15 MED ORDER — TECHNETIUM TC 99M SESTAMIBI GENERIC - CARDIOLITE
8.3000 | Freq: Once | INTRAVENOUS | Status: AC | PRN
  Administered 2012-10-15: 8 via INTRAVENOUS

## 2012-10-15 MED ORDER — TECHNETIUM TC 99M SESTAMIBI GENERIC - CARDIOLITE
24.2000 | Freq: Once | INTRAVENOUS | Status: AC | PRN
  Administered 2012-10-15: 24.2 via INTRAVENOUS

## 2012-10-15 NOTE — Procedures (Addendum)
[facility] [address] [place] [address] [phone]  Cardiology Nuclear Med Study  Seth Sanchez is a 62 y.o. male     MRN : [id number]     DOB: 1951/04/04  Procedure Date: 10/15/2012  Nuclear Med Background Indication for Stress Test:  Graft Patency and Post Hospital History:  CABG x 2 95, Redo 2012, MI 2010 Cardiac Risk Factors: Hypertension, Lipids and Diabetic  Symptoms:  Chest Pain   Nuclear Pre-Procedure Caffeine/Decaff Intake:  7:00pm NPO After: 5:00am   IV Site: R Antecubital  IV 0.9% NS with Angio Cath:  22g  Chest Size (in):  42 IV Started by: [name], CNMT  Height: 5\' 5"  (1.651 m)  Cup Size: n/a  BMI:  Body mass index is 29.79 kg/(m^2). Weight:  179 lb (81.194 kg)   Tech Comments:  n/a    Nuclear Med Study 1 or 2 day study: 1 day  Stress Test Type:  Lexiscan  Order Authorizing Provider:  Nicki Guadalajara, MD   Resting Radionuclide: Technetium 41m Sestamibi  Resting Radionuclide Dose: 8.3 mCi   Stress Radionuclide:  Technetium 79m Sestamibi  Stress Radionuclide Dose: 24.2 mCi           Stress Protocol Rest HR: 62 Stress HR: 96  Rest BP: 157/76 Stress BP: 130/66  Exercise Time (min): n/a METS: n/a          Dose of Adenosine (mg):  n/a Dose of Lexiscan: 0.4 mg  Dose of Atropine (mg): n/a Dose of Dobutamine: n/a mcg/kg/min (at max HR)  Stress Test Technologist: [name], CCT Nuclear Technologist: [name], CNMT   Rest Procedure:  Myocardial perfusion imaging was performed at rest 45 minutes following the intravenous administration of Technetium 57m Sestamibi. Stress Procedure:  The patient received IV Lexiscan 0.4 mg over 15-seconds.  Technetium 64m Sestamibi injected at 30-seconds.  There were no significant changes with Lexiscan.  Quantitative spect images were obtained after a 45 minute delay.  Transient Ischemic Dilatation (Normal <1.22):  1.07  Lung/Heart Ratio (Normal  <0.45):  0.40 QGS EDV:  75 ml QGS ESV:  25 ml LV Ejection Fraction: 67%        Rest ECG: NSR - Normal EKG  Stress ECG: No significant change from baseline ECG  QPS Raw Data Images:  Normal; no motion artifact; normal heart/lung ratio. Stress Images:  Normal homogeneous uptake in all areas of the myocardium. Rest Images:  Normal homogeneous uptake in all areas of the myocardium. Subtraction (SDS):  No evidence of ischemia.  Impression Exercise Capacity:  Lexiscan with no exercise. BP Response:  Normal blood pressure response. Clinical Symptoms:  No significant symptoms noted. ECG Impression:  No significant ST segment change suggestive of ischemia. Comparison with Prior Nuclear Study: No significant change from previous study  Overall Impression:  Normal stress nuclear study.  LV Wall Motion:  NL LV Function; NL Wall Motion, except postoperative paradoxical septal motion.   [name], MD  10/16/2012 12:16 PM

## 2013-01-23 ENCOUNTER — Encounter: Payer: Self-pay | Admitting: Cardiovascular Disease

## 2013-06-03 ENCOUNTER — Other Ambulatory Visit: Payer: Self-pay | Admitting: Cardiovascular Disease

## 2013-07-29 ENCOUNTER — Encounter: Payer: Self-pay | Admitting: Cardiovascular Disease

## 2013-07-29 ENCOUNTER — Ambulatory Visit (INDEPENDENT_AMBULATORY_CARE_PROVIDER_SITE_OTHER): Payer: BC Managed Care – PPO | Admitting: Cardiovascular Disease

## 2013-07-29 VITALS — BP 132/74 | HR 63 | Ht 65.0 in | Wt 177.2 lb

## 2013-07-29 DIAGNOSIS — I10 Essential (primary) hypertension: Secondary | ICD-10-CM | POA: Insufficient documentation

## 2013-07-29 DIAGNOSIS — I2510 Atherosclerotic heart disease of native coronary artery without angina pectoris: Secondary | ICD-10-CM

## 2013-07-29 DIAGNOSIS — R5381 Other malaise: Secondary | ICD-10-CM

## 2013-07-29 DIAGNOSIS — E785 Hyperlipidemia, unspecified: Secondary | ICD-10-CM

## 2013-07-29 DIAGNOSIS — E119 Type 2 diabetes mellitus without complications: Secondary | ICD-10-CM

## 2013-07-29 DIAGNOSIS — Z23 Encounter for immunization: Secondary | ICD-10-CM

## 2013-07-29 NOTE — Progress Notes (Signed)
Patient ID: Tomislav Micale, male   DOB: 07/03/1951, 62 y.o.   MRN: [id number]     HPI: Kenechukwu Eckstein is a 62 y.o. male presents to the office for a nine-month cardiology evaluation.  Mr. Jaisean Monteforte has established coronary artery disease and underwent initial CABG surgery 1995 with LIMA to the LAD, and vein to the obtuse marginal vessel. In August 2010 he suffered an inferior wall MI and his RCA was occluded prior to a previously placed stent.  He had placement of 3 tandem stents with restoration of normal flow with demonstration of almost complete salvage of myocardium. He developed recurrent episodes of restenosis even despite brachytherapy and on 11/12/2010 underwent redo surgery by Dr. [name] with a  graft to the distal RCA. Peri-peratively he did have insulin resistance and ultimately became overtly diabetic but subsequently with significant dietary adjustments this has essentially normalized. When I last saw him in January 2014, he had a nuclear perfusion study in January 2014 prior to attending a business conference. His nuclear study continued to show normal perfusion and function. When I last  saw him last in NMR lipoprofile in January showed increased LDL particle numbers 1646 and his atorvastatin dose was increased from 20 to 40 mg. I also further try to titrate his Ramapril from 5 to10 mg for optimal blood pressure control.  Over the past 9 months he has done well. He remains asymptomatic. He is being active. He exercises regularly. 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  Past Medical History  Diagnosis Date  . MI (myocardial infarction)     diaphramatic- Dr. Daphene Jaeger  . Depression   . Vitamin D deficiency   . Diaphragmatic disorder     MI  . CAD (coronary artery disease)   . Dyslipidemia     Past Surgical History  Procedure Laterality Date  . Cardiac catheterization      x5  . Coronary artery bypass  graft      R coronary artery stent  . Coronary artery bypass graft      No Known Allergies  Current Outpatient Prescriptions  Medication Sig Dispense Refill  . aspirin EC 325 MG tablet Take 325 mg by mouth daily.        [name] atorvastatin (LIPITOR) 40 MG tablet TAKE 1 TABLET BY MOUTH DAILY  30 tablet  5  . Cholecalciferol (VITAMIN D-3) 5000 UNITS TABS Take 1 capsule by mouth daily.       . Coenzyme Q10 (COQ10) 100 MG CAPS Take 1 capsule by mouth daily.       . finasteride (PROPECIA) 1 MG tablet Take 1 mg by mouth daily.        [name] glucose blood (ONE TOUCH ULTRA TEST) test strip 1 each by Other route daily. Use as instructed       . isosorbide mononitrate (IMDUR) 30 MG 24 hr tablet Take 1 tablet (30 mg total) by mouth daily.  30 tablet  0  . lamoTRIgine (LAMICTAL) 200 MG tablet Take 200 mg by mouth daily.        . metoprolol (LOPRESSOR) 50 MG tablet Take 0.5 tablets (25 mg total) by mouth 2 (two) times daily.  60 tablet  0  . niacin (NIASPAN) 1000 MG CR tablet Take 1,000 mg by mouth at bedtime.        . nitroGLYCERIN (NITROSTAT) 0.4 MG SL tablet Place 1 tablet (  0.4 mg total) under the tongue every 5 (five) minutes as needed for chest pain (for 3 doses if you develop chest pain and go to the emergency department.).  30 tablet  2  . ONE TOUCH LANCETS MISC by Does not apply route daily.        . ramipril (ALTACE) 10 MG capsule Take 10 mg by mouth daily.       No current facility-administered medications for this visit.    History   Social History  . Marital Status: Married    Spouse Name: N/A    Number of Children: N/A  . Years of Education: N/A   Occupational History  . SELF EMPLOYED    Social History Main Topics  . Smoking status: Former Smoker    Types: Cigarettes    Quit date: 07/30/2007  . Smokeless tobacco: Former Neurosurgeon    Quit date: 09/23/2006     Comment: quit in 1995, started in 2005, quit in 2008  . Alcohol Use: No  . Drug Use: No  . Sexual Activity: Not on file   Other  Topics Concern  . Not on file   Social History Narrative   NO REG EXERCISE          Family History  Problem Relation Age of Onset  . Diabetes Mother   . Cancer Father     lung cancer   Socially he is remarried. There is no recent tobacco or alcohol use.  ROS is negative for fevers, chills or night sweats. He denies visual changes. He denies shortness of breath. He denies cough or sputum production. Denies presyncope or syncope. He denies anginal symptoms. He denies abdominal discomfort. There are no GU symptoms or GI symptoms. There is no claudication. He denies myalgias. He does have insulin resistance but recently has not had overt diabetes. There is no endocrine issues. Does have hyperlipidemia. Her mobility was a history of depression.  Other comprehensive 12 point system review is negative.  PE BP 132/74  Pulse 63  Ht 5\' 5"  (1.651 m)  Wt 177 lb 3.2 oz (80.377 kg)  BMI 29.49 kg/m2  General: Alert, oriented, no distress.  Skin: normal turgor, no rashes HEENT: Normocephalic, atraumatic. Pupils round and reactive; sclera anicteric;no lid lag.  Nose without nasal septal hypertrophy Mouth/Parynx benign; Mallinpatti scale 2 Neck: No JVD, no carotid briuts Lungs: clear to ausculatation and percussion; no wheezing or rales Heart: RRR, s1 s2 normal 1/6 systolic murmur Abdomen: soft, nontender; no hepatosplenomehaly, BS+; abdominal aorta nontender and not dilated by palpation. Pulses 2+ Extremities: no clubbing cyanosis or edema, Homan's sign negative  Neurologic: grossly nonfocal Psychologic: normal affect and mood.  ECG: Normal sinus rhythm at 63 beats per minute. QTc interval 43 ms. Her restoration of normal  LABS:  BMET    Component Value Date/Time   NA 138 10/04/2012 1840   K 3.8 10/04/2012 1840   CL 104 10/04/2012 1840   CO2 24 10/04/2012 1840   GLUCOSE 92 10/04/2012 1840   BUN 17 10/04/2012 1840   CREATININE 0.98 10/04/2012 1840   CALCIUM 9.2 10/04/2012 1840   GFRNONAA  88* 10/04/2012 1840   GFRAA >90 10/04/2012 1840     Hepatic Function Panel     Component Value Date/Time   PROT 6.9 11/11/2010 1320   ALBUMIN 4.0 11/11/2010 1320   AST 27 11/11/2010 1320   ALT 30 11/11/2010 1320   ALKPHOS 50 11/11/2010 1320   BILITOT 0.4 11/11/2010 1320  CBC    Component Value Date/Time   WBC 5.8 10/04/2012 1840   RBC 4.56 10/04/2012 1840   HGB 13.4 10/04/2012 1840   HCT 38.8* 10/04/2012 1840   PLT 198 10/04/2012 1840   MCV 85.1 10/04/2012 1840   MCH 29.4 10/04/2012 1840   MCHC 34.5 10/04/2012 1840   RDW 12.3 10/04/2012 1840   LYMPHSABS 1.7 10/04/2012 1840   MONOABS 0.7 10/04/2012 1840   EOSABS 0.3 10/04/2012 1840   BASOSABS 0.0 10/04/2012 1840     BNP    Component Value Date/Time   PROBNP 94.8 10/04/2012 1840    Lipid Panel     Component Value Date/Time   CHOL  Value: 119        ATP III CLASSIFICATION:  <200     mg/dL   Desirable  096-045  mg/dL   Borderline High  >=409    mg/dL   High        05/03/9146 0430   TRIG 40 06/13/2010 0430   HDL 52 06/13/2010 0430   CHOLHDL 2.3 06/13/2010 0430   VLDL 8 06/13/2010 0430   LDLCALC  Value: 59        Total Cholesterol/HDL:CHD Risk Coronary Heart Disease Risk Table                     Men   Women  1/2 Average Risk   3.4   3.3  Average Risk       5.0   4.4  2 X Average Risk   9.6   7.1  3 X Average Risk  23.4   11.0        Use the calculated Patient Ratio above and the CHD Risk Table to determine the patient's CHD Risk.        ATP III CLASSIFICATION (LDL):  <100     mg/dL   Optimal  829-562  mg/dL   Near or Above                    Optimal  130-159  mg/dL   Borderline  130-865  mg/dL   High  >784     mg/dL   Very High 6/96/2952 8413     RADIOLOGY: No results found.    ASSESSMENT AND PLAN:  My impression is that Mr. Daevon Holdren is a 62 year old gentleman who is now 19 years status post initial CBG revascularization surgery 1995 to his left artery system within it into the LAD and vein graft to the marginal vessel. He is almost  3 years status post a second bypass operation to his right eye artery which had developed recurrent restenosis following multiple interventions. He has had complete salvage of myocardium demonstrated on nuclear imaging. Presently I am recommending repeat laboratory be obtained consisting of an NMR profile, CBC, CMP  particularly with his increased medical regimen from his last office visit. We'll recheck hemoglobin A1c. We'll also check thyroid function studies. He said it atherogenic dyslipidemia panel in the past. As long as he remains stable, we'll see him in 6 months for followup evaluation. We will also give a flu shot today.    Lennette Bihari, MD, [facility]  07/29/2013 10:42 AM

## 2013-07-29 NOTE — Patient Instructions (Signed)
Your physician recommends that you return for lab work fasting.  Your physician recommends that you schedule a follow-up appointment in:6 MONTHS. 

## 2013-08-03 ENCOUNTER — Encounter: Payer: Self-pay | Admitting: Cardiovascular Disease

## 2013-11-29 ENCOUNTER — Other Ambulatory Visit: Payer: Self-pay | Admitting: Cardiovascular Disease

## 2013-11-29 NOTE — Telephone Encounter (Signed)
Rx was sent to pharmacy electronically. 

## 2014-01-27 ENCOUNTER — Telehealth: Payer: Self-pay | Admitting: Cardiovascular Disease

## 2014-01-27 NOTE — Telephone Encounter (Signed)
Please call, pt need to be seen,stop taking his beta blocker,his BP been up high.

## 2014-01-28 ENCOUNTER — Ambulatory Visit (INDEPENDENT_AMBULATORY_CARE_PROVIDER_SITE_OTHER): Payer: BC Managed Care – PPO | Admitting: Internal Medicine

## 2014-01-28 ENCOUNTER — Encounter: Payer: Self-pay | Admitting: Internal Medicine

## 2014-01-28 ENCOUNTER — Other Ambulatory Visit (INDEPENDENT_AMBULATORY_CARE_PROVIDER_SITE_OTHER): Payer: BC Managed Care – PPO

## 2014-01-28 ENCOUNTER — Telehealth: Payer: Self-pay | Admitting: Cardiovascular Disease

## 2014-01-28 VITALS — BP 116/70 | HR 74 | Temp 98.0°F | Ht 65.0 in | Wt 178.8 lb

## 2014-01-28 DIAGNOSIS — R109 Unspecified abdominal pain: Secondary | ICD-10-CM

## 2014-01-28 DIAGNOSIS — E559 Vitamin D deficiency, unspecified: Secondary | ICD-10-CM

## 2014-01-28 DIAGNOSIS — Z0000 Encounter for general adult medical examination without abnormal findings: Secondary | ICD-10-CM

## 2014-01-28 DIAGNOSIS — E1159 Type 2 diabetes mellitus with other circulatory complications: Secondary | ICD-10-CM

## 2014-01-28 DIAGNOSIS — N4289 Other specified disorders of prostate: Secondary | ICD-10-CM

## 2014-01-28 LAB — MICROALBUMIN / CREATININE URINE RATIO
CREATININE, U: 99.5 mg/dL
Microalb Creat Ratio: 0.1 mg/g (ref 0.0–30.0)
Microalb, Ur: 0.1 mg/dL (ref 0.0–1.9)

## 2014-01-28 LAB — CBC WITH DIFFERENTIAL/PLATELET
BASOS PCT: 0.3 % (ref 0.0–3.0)
Basophils Absolute: 0 10*3/uL (ref 0.0–0.1)
EOS PCT: 2.2 % (ref 0.0–5.0)
Eosinophils Absolute: 0.2 10*3/uL (ref 0.0–0.7)
HEMATOCRIT: 43.2 % (ref 39.0–52.0)
Hemoglobin: 14.7 g/dL (ref 13.0–17.0)
LYMPHS ABS: 1.4 10*3/uL (ref 0.7–4.0)
Lymphocytes Relative: 19.5 % (ref 12.0–46.0)
MCHC: 34 g/dL (ref 30.0–36.0)
MCV: 87.3 fl (ref 78.0–100.0)
Monocytes Absolute: 0.8 10*3/uL (ref 0.1–1.0)
Monocytes Relative: 11.4 % (ref 3.0–12.0)
Neutro Abs: 4.9 10*3/uL (ref 1.4–7.7)
Neutrophils Relative %: 66.6 % (ref 43.0–77.0)
Platelets: 229 10*3/uL (ref 150.0–400.0)
RBC: 4.95 Mil/uL (ref 4.22–5.81)
RDW: 12.9 % (ref 11.5–15.5)
WBC: 7.3 10*3/uL (ref 4.0–10.5)

## 2014-01-28 LAB — BASIC METABOLIC PANEL
BUN: 20 mg/dL (ref 6–23)
CHLORIDE: 104 meq/L (ref 96–112)
CO2: 28 mEq/L (ref 19–32)
Calcium: 9.4 mg/dL (ref 8.4–10.5)
Creatinine, Ser: 0.9 mg/dL (ref 0.4–1.5)
GFR: 91.03 mL/min (ref >60.00)
Glucose, Bld: 99 mg/dL (ref 70–99)
POTASSIUM: 4.2 meq/L (ref 3.5–5.1)
SODIUM: 139 meq/L (ref 135–145)

## 2014-01-28 LAB — HEPATIC FUNCTION PANEL
ALBUMIN: 4 g/dL (ref 3.5–5.2)
ALK PHOS: 45 U/L (ref 39–117)
ALT: 25 U/L (ref 0–53)
AST: 24 U/L (ref 0–37)
BILIRUBIN DIRECT: 0.1 mg/dL (ref 0.0–0.3)
TOTAL PROTEIN: 6.8 g/dL (ref 6.0–8.3)
Total Bilirubin: 0.7 mg/dL (ref 0.2–1.2)

## 2014-01-28 LAB — PSA: PSA: 0.48 ng/mL (ref 0.10–4.00)

## 2014-01-28 LAB — HEMOGLOBIN A1C: Hgb A1c MFr Bld: 6.2 % (ref 4.6–6.5)

## 2014-01-28 LAB — LIPASE: Lipase: 33 U/L (ref 11.0–59.0)

## 2014-01-28 LAB — AMYLASE: Amylase: 43 U/L (ref 27–131)

## 2014-01-28 MED ORDER — DEXLANSOPRAZOLE 60 MG PO CPDR: 60.0000 mg | DELAYED_RELEASE_CAPSULE | Freq: Every day | ORAL | Status: DC

## 2014-01-28 NOTE — Patient Instructions (Signed)
Your next office appointment will be determined based upon review of your pending labs . Those instructions will be transmitted to you through My Chart   Reflux of gastric acid may be asymptomatic as this may occur mainly during sleep.The triggers for reflux  include stress; the "aspirin family" ; alcohol; peppermint; and caffeine (coffee, tea, cola, and chocolate). The aspirin family would include aspirin and the nonsteroidal agents such as ibuprofen &  Naproxen. Tylenol would not cause reflux. If having symptoms ; food & drink should be avoided for @ least 2 hours before going to bed.  

## 2014-01-28 NOTE — Telephone Encounter (Signed)
Pt BP is up and his sugar level is up also.Pt have not been taking his medicine.

## 2014-01-28 NOTE — Telephone Encounter (Signed)
Returned call and pt verified x 2 w/ Corrine, pt's wife.  Stated she wanted to review pt's meds b/c he has an appt w/ Dr. Alwyn RenHopper.  RN asked wife to start review.  Wife stated she doesn't have list of meds and wants RN to tell her what pt is supposed to be taking.  Wife informed RN will read list of meds/dosages as listed, as pt was last seen in November 2014.  Wife agreed.  List read and wife sounded to be writing down list.  Stated pt has had high BP for several days and when she talked to him about it, he stopped taking some meds b/c they made him sick.  Stated pt has not been taking metoprolol or ramipril.  Pt has an appt today w/ Dr. Alwyn RenHopper and in our office in 2 weeks.

## 2014-01-28 NOTE — Progress Notes (Signed)
Pre visit review using our clinic review tool, if applicable. No additional management support is needed unless otherwise documented below in the visit note. 

## 2014-01-28 NOTE — Progress Notes (Signed)
   Subjective:    Patient ID: Seth Sanchez, male    DOB: 03-May-1951, 63 y.o.   MRN: [id number]  HPI  He is here for a physical;acute issues abdominal pain.     Review of Systems   For least a week (wife states "longer") he's been having diffuse abdominal discomfort which he describes as "24/7" unaffected by food or drink. It is not exertional. It is not associated with chest pain , dyspnea,nausea or sweating . He does have some sweating at night.  He denies significant dyspepsia, dysphagia, unexplained weight loss, melena, or rectal bleeding  He does drink a pot of coffee but it is "mainly decaf". Other than 325 mg of aspirin each morning; he is not on anti-inflammatory meds. He has not smoked since 2008. He does not drink. FBS 117-140 in past few days.     Objective:   Physical Exam   Significant or distinguishing  findings on physical exam include: Some abdominal tenderness;ventral hernia. Asymmetric prostate ;small vertical ridge on L. FOB negative. Gen.: Healthy and well-nourished in appearance. Alert, appropriate and cooperative throughout exam.  Head: Normocephalic without obvious abnormalities Eyes: No corneal or conjunctival inflammation noted. Pupils equal round reactive to light and accommodation. Extraocular motion intact.No icterus Ears: External  ear exam reveals no significant lesions or deformities. Canals clear .TMs normal. Hearing is grossly normal bilaterally. Nose: External nasal exam reveals no deformity or inflammation. Nasal mucosa are pink and moist. No lesions or exudates noted.   Mouth: Oral mucosa and oropharynx reveal no lesions or exudates. Teeth in good repair. Neck: No deformities, masses, or tenderness noted. Range of motion & Thyroid normal. Lungs: Normal respiratory effort; chest expands symmetrically. Lungs are clear to auscultation without rales, wheezes, or increased work of breathing. Heart: Normal rate and rhythm. Normal S1 and S2. No gallop,  click, or rub.No murmur. Abdomen: Bowel sounds normal; abdomen soft and nontender. No masses, organomegaly or hernias noted. Genitalia: Genitalia normal except for left varices. Musculoskeletal/extremities: No deformity or scoliosis noted of  the thoracic or lumbar spine.  No clubbing, cyanosis, edema, or significant extremity  deformity noted. Range of motion normal .Tone & strength normal. Hand joints normal  Fingernail  health good. Able to lie down & sit up w/o help. Negative SLR bilaterally Vascular: Carotid, radial artery, dorsalis pedis and  posterior tibial pulses are full and equal. No bruits present. Neurologic: Alert and oriented x3. Deep tendon reflexes symmetrical and normal.  Gait normal       Skin: Intact without suspicious lesions or rashes.Sternotomy op scar well healed. Lymph: No cervical, axillary, or inguinal lymphadenopathy present. Psych: Mood and affect are normal. Normally interactive                                                                                         Assessment & Plan:  #1 comprehensive physical exam #2 abdominal pain, diffuse. R/O gastritis , pancreatitis  Plan: see Orders  & Recommendations

## 2014-02-02 ENCOUNTER — Other Ambulatory Visit: Payer: Self-pay | Admitting: Gastroenterology

## 2014-02-02 DIAGNOSIS — R109 Unspecified abdominal pain: Secondary | ICD-10-CM

## 2014-02-03 LAB — VITAMIN D 1,25 DIHYDROXY
Vitamin D 1, 25 (OH)2 Total: 61 pg/mL (ref 18–72)
Vitamin D2 1, 25 (OH)2: <8 pg/mL
Vitamin D3 1, 25 (OH)2: 61 pg/mL

## 2014-02-04 ENCOUNTER — Ambulatory Visit
Admission: RE | Admit: 2014-02-04 | Discharge: 2014-02-04 | Disposition: A | Discharge status: Home / Self Care | Payer: BC Managed Care – PPO | Source: Ambulatory Visit | Attending: Gastroenterology | Admitting: Gastroenterology

## 2014-02-04 DIAGNOSIS — R109 Unspecified abdominal pain: Secondary | ICD-10-CM

## 2014-02-09 ENCOUNTER — Ambulatory Visit (INDEPENDENT_AMBULATORY_CARE_PROVIDER_SITE_OTHER): Payer: BC Managed Care – PPO | Admitting: Cardiology

## 2014-02-09 ENCOUNTER — Encounter: Payer: Self-pay | Admitting: Cardiology

## 2014-02-09 VITALS — BP 142/80 | HR 73 | Ht 65.0 in | Wt 181.0 lb

## 2014-02-09 DIAGNOSIS — E119 Type 2 diabetes mellitus without complications: Secondary | ICD-10-CM | POA: Insufficient documentation

## 2014-02-09 DIAGNOSIS — I2510 Atherosclerotic heart disease of native coronary artery without angina pectoris: Secondary | ICD-10-CM

## 2014-02-09 DIAGNOSIS — I10 Essential (primary) hypertension: Secondary | ICD-10-CM

## 2014-02-09 MED ORDER — RAMIPRIL 5 MG PO CAPS: 5.0000 mg | ORAL_CAPSULE | Freq: Every day | ORAL | Status: DC

## 2014-02-09 MED ORDER — NITROGLYCERIN 0.4 MG SL SUBL: 0.4000 mg | SUBLINGUAL_TABLET | SUBLINGUAL | Status: DC | PRN

## 2014-02-09 MED ORDER — METOPROLOL TARTRATE 50 MG PO TABS: 25.0000 mg | ORAL_TABLET | Freq: Two times a day (BID) | ORAL | Status: DC

## 2014-02-09 NOTE — Patient Instructions (Signed)
Start Metoprolol 50 mg - 1/2 tablet twice a day Start Ramipril 5 mg daily Follow up with Dr [name] in 6 months Please call in a few weeks and let us know how your B/P is doing.

## 2014-02-09 NOTE — Assessment & Plan Note (Addendum)
No recent angina  

## 2014-02-09 NOTE — Assessment & Plan Note (Signed)
Dr Alwyn RenHopper follows

## 2014-02-09 NOTE — Progress Notes (Signed)
02/09/2014 Seth Sanchez   Feb 02, 1951  [id number]  Primary Physicia Marga MelnickWilliam Hopper, MD Primary Cardiologist: Dr [name]  HPI:   63 y/o followed by Dr [name] with a history of CAD. He is S/P CABG x 2 with an LIMA-LAD and SVG-RCA in '95. He had multiple subsequent RCA PCIs and eventually had a redo CABG x1 with SVG-RCA in Feb 2012. He has done well since. His last Myoview was Jan 2014. He has diet controlled diabetes and is followed by Dr Alwyn RenHopper.             He is here for a routine check up. He denies chest pain or SOB. He says his Rx for Metoprolol and Ramipril had run out some time ago and he never got them refilled. His B/P has been running high recently.    Current Outpatient Prescriptions  Medication Sig Dispense Refill  . metoprolol succinate (TOPROL-XL) 50 MG 24 hr tablet Take 50 mg by mouth daily. Take 1/2 tablet two times a day      . aspirin EC 325 MG tablet Take 325 mg by mouth daily.        Seth Sanchez. atorvastatin (LIPITOR) 40 MG tablet TAKE 1 TABLET BY MOUTH DAILY  30 tablet  7  . Cholecalciferol (VITAMIN D-3) 5000 UNITS TABS Take 1 capsule by mouth daily.       . Coenzyme Q10 (COQ10) 100 MG CAPS Take 1 capsule by mouth daily.       Seth Sanchez. dexlansoprazole (DEXILANT) 60 MG capsule Take 1 capsule (60 mg total) by mouth daily.  10 capsule  0  . finasteride (PROPECIA) 1 MG tablet Take 1 mg by mouth daily.        Seth Sanchez. glucose blood (ONE TOUCH ULTRA TEST) test strip 1 each by Other route daily. Use as instructed       . lamoTRIgine (LAMICTAL) 200 MG tablet Take 200 mg by mouth daily.        . metoprolol (LOPRESSOR) 50 MG tablet Take 0.5 tablets (25 mg total) by mouth 2 (two) times daily.  180 tablet  3  . niacin (NIASPAN) 1000 MG CR tablet Take 1,000 mg by mouth at bedtime.        . nitroGLYCERIN (NITROSTAT) 0.4 MG SL tablet Place 1 tablet (0.4 mg total) under the tongue every 5 (five) minutes as needed for chest pain (for 3 doses if you develop chest pain and go to the emergency department.).  30  tablet  2  . ONE TOUCH LANCETS MISC by Does not apply route daily.        . ramipril (ALTACE) 5 MG capsule Take 1 capsule (5 mg total) by mouth daily.  90 capsule  3   No current facility-administered medications for this visit.    No Known Allergies  History   Social History  . Marital Status: Married    Spouse Name: N/A    Number of Children: N/A  . Years of Education: N/A   Occupational History  . SELF EMPLOYED    Social History Main Topics  . Smoking status: Former Smoker    Types: Cigarettes    Quit date: 07/30/2007  . Smokeless tobacco: Former NeurosurgeonUser    Quit date: 09/23/2006     Comment: smoked 1980-1995; smoked 2005-2008. Maximum of 1.5 ppd  . Alcohol Use: No  . Drug Use: No  . Sexual Activity: Not on file   Other Topics Concern  . Not on file   Social  History Narrative   NO REG EXERCISE           Review of Systems: General: negative for chills, fever, night sweats or weight changes.  Cardiovascular: negative for chest pain, dyspnea on exertion, edema, orthopnea, palpitations, paroxysmal nocturnal dyspnea or shortness of breath Dermatological: negative for rash Respiratory: negative for cough or wheezing Urologic: negative for hematuria Abdominal: negative for nausea, vomiting, diarrhea, bright red blood per rectum, melena, or hematemesis Neurologic: negative for visual changes, syncope, or dizziness All other systems reviewed and are otherwise negative except as noted above.    Blood pressure 142/80, pulse 73, height 5\' 5"  (1.651 m), weight 181 lb (82.101 kg).  General appearance: alert, cooperative and no distress Lungs: clear to auscultation bilaterally Heart: regular rate and rhythm  EKG NSR without acute changes  ASSESSMENT AND PLAN:   CABG x 2 '95. Multiple RCA PCI's- Re Do CABG x 1 2012. Low risk Myoview Jan 2014 No recent angina  Diabetes mellitus- diet controlled Dr Alwyn RenHopper follows  Essential hypertension B/P not well controlled. He  apparently ran out of Metoprolol and Ramipril.   PLAN  I suggested he resume his Metoprolol and his Ramipril and explained the benefits of these medications in pt's with HTN, DM, and CAD. He also says he will start exercising again which I encouraged. He will let us know how his B/P is doing. He can see Dr [name] in 6 months.   Eda PaschalLuke K Platte Valley Medical CenterKilroyPA-C 02/09/2014 3:02 PM

## 2014-02-09 NOTE — Assessment & Plan Note (Signed)
B/P not well controlled. He apparently ran out of Metoprolol and Ramipril.

## 2014-03-30 ENCOUNTER — Telehealth: Payer: Self-pay | Admitting: *Deleted

## 2014-03-30 DIAGNOSIS — E119 Type 2 diabetes mellitus without complications: Secondary | ICD-10-CM

## 2014-03-30 NOTE — Telephone Encounter (Signed)
Patient will come by the lab for a lipid panel. Lipid ordered Diabetic bundle

## 2014-05-09 ENCOUNTER — Encounter: Payer: Self-pay | Admitting: Internal Medicine

## 2014-08-26 ENCOUNTER — Telehealth: Payer: Self-pay | Admitting: Cardiovascular Disease

## 2014-08-26 MED ORDER — NITROGLYCERIN 0.4 MG SL SUBL: 0.4000 mg | SUBLINGUAL_TABLET | SUBLINGUAL | Status: DC | PRN

## 2014-08-26 NOTE — Telephone Encounter (Signed)
Pt scheduled for F/U 12/17. Wanted to have Hgb A1C checked in addition to "the regular labwork". Will defer to provider to order at time of appt. Pt requested NTG. Has denied needing it since last visit but wants to have it on hand.

## 2014-08-26 NOTE — Telephone Encounter (Signed)
Corrine called in stating that the pt usually has labs done when coming in for his annual and he also needs a new prescription for his NTG called in to the [facility] [address]. Please call pt   Thanks

## 2014-09-08 ENCOUNTER — Ambulatory Visit (INDEPENDENT_AMBULATORY_CARE_PROVIDER_SITE_OTHER): Payer: BC Managed Care – PPO | Admitting: Cardiology

## 2014-09-08 ENCOUNTER — Encounter: Payer: Self-pay | Admitting: Cardiology

## 2014-09-08 VITALS — BP 120/70 | HR 57 | Ht 65.0 in | Wt 178.1 lb

## 2014-09-08 DIAGNOSIS — E118 Type 2 diabetes mellitus with unspecified complications: Secondary | ICD-10-CM

## 2014-09-08 DIAGNOSIS — I10 Essential (primary) hypertension: Secondary | ICD-10-CM

## 2014-09-08 DIAGNOSIS — E785 Hyperlipidemia, unspecified: Secondary | ICD-10-CM

## 2014-09-08 NOTE — Patient Instructions (Signed)
Please follow up with Dr. [name] in 6 months.  Your physician recommends that you return for lab work and you need to be FASTING (no food or drink).

## 2014-09-08 NOTE — Progress Notes (Signed)
09/08/2014 Seth Sanchez   05-Apr-1951  [id number]  Primary Physician Marga MelnickWilliam Hopper, MD Primary Cardiologist: Dr. Tresa Sanchez  HPI:  The patient is a 63 year old male, followed by Dr. Tresa Sanchez, who presents to clinic today for routine 6 month cardiovascular evaluation. He has a history of CAD. He S/P CABG 2 with a LIMA-LAD and SVG-RCA in 1995. Since then, he has had multiple RCA PCI's and eventually had  redo CABG 1 with SVG-RCA in February 2012. He has done well since. His last Myoview was in January 2014 which was negative for ischemia. EF was normal at 67%. His history is also notable for treated hyperlipidemia and diabetes which is diet controlled. He is followed medically by Dr. Alwyn RenHopper.  Today in clinic, he reports that he has done well since his last office visit. He reports full medication compliance. He denies any anginal symptoms. He also denies dyspnea, orthopnea, PND, lower extremity edema, palpitations, lightheadedness, dizziness, syncope/near-syncope. He exercises regularly. He walks almost daily, at least 30-60 minutes at a time. He denies any symptoms or limitations with physical activity.   Current Outpatient Prescriptions  Medication Sig Dispense Refill  . aspirin EC 325 MG tablet Take 325 mg by mouth daily.      [name]. atorvastatin (LIPITOR) 40 MG tablet TAKE 1 TABLET BY MOUTH DAILY 30 tablet 7  . Cholecalciferol (VITAMIN D-3) 5000 UNITS TABS Take 1 capsule by mouth daily.     . Coenzyme Q10 (COQ10) 100 MG CAPS Take 1 capsule by mouth daily.     [name]. dexlansoprazole (DEXILANT) 60 MG capsule Take 1 capsule (60 mg total) by mouth daily. 10 capsule 0  . finasteride (PROPECIA) 1 MG tablet Take 1 mg by mouth daily.      [name]. glucose blood (ONE TOUCH ULTRA TEST) test strip 1 each by Other route daily. Use as instructed     . metoprolol (LOPRESSOR) 50 MG tablet Take 0.5 tablets (25 mg total) by mouth 2 (two) times daily. 180 tablet 3  . metoprolol succinate (TOPROL-XL) 50 MG 24 hr tablet Take  50 mg by mouth daily. Take 1/2 tablet two times a day    . niacin (NIASPAN) 1000 MG CR tablet Take 1,000 mg by mouth at bedtime.      . nitroGLYCERIN (NITROSTAT) 0.4 MG SL tablet Place 1 tablet (0.4 mg total) under the tongue every 5 (five) minutes as needed for chest pain. 25 tablet 3  . ONE TOUCH LANCETS MISC by Does not apply route daily.      . ramipril (ALTACE) 5 MG capsule Take 1 capsule (5 mg total) by mouth daily. 90 capsule 3   No current facility-administered medications for this visit.    No Known Allergies  History   Social History  . Marital Status: Married    Spouse Name: N/A    Number of Children: N/A  . Years of Education: N/A   Occupational History  . SELF EMPLOYED    Social History Main Topics  . Smoking status: Former Smoker    Types: Cigarettes    Quit date: 07/30/2007  . Smokeless tobacco: Former NeurosurgeonUser    Quit date: 09/23/2006     Comment: smoked 1980-1995; smoked 2005-2008. Maximum of 1.5 ppd  . Alcohol Use: No  . Drug Use: No  . Sexual Activity: Not on file   Other Topics Concern  . Not on file   Social History Narrative   NO REG EXERCISE  Review of Systems: General: negative for chills, fever, night sweats or weight changes.  Cardiovascular: negative for chest pain, dyspnea on exertion, edema, orthopnea, palpitations, paroxysmal nocturnal dyspnea or shortness of breath Dermatological: negative for rash Respiratory: negative for cough or wheezing Urologic: negative for hematuria Abdominal: negative for nausea, vomiting, diarrhea, bright red blood per rectum, melena, or hematemesis Neurologic: negative for visual changes, syncope, or dizziness All other systems reviewed and are otherwise negative except as noted above.    Blood pressure 120/70, pulse 57, height 5\' 5"  (1.651 m), weight 178 lb 1.6 oz (80.786 kg).  General appearance: alert, cooperative and no distress Neck: no carotid bruit and no JVD Lungs: clear to auscultation  bilaterally Heart: regular rate and rhythm, S1, S2 normal, no murmur, click, rub or gallop Extremities: no LEE Pulses: 2+ and symmetric Skin: Skin color, texture, turgor normal. No rashes or lesions Neurologic: Grossly normal  EKG sinus bradycardia. Heart rate 57 bpm  ASSESSMENT AND PLAN:   1. CAD: Denies anginal symptoms. No limitations with physical activity. His EKG demonstrates sinus bradycardia with heart rate of 57 bpm. No ischemic abnormalities noted. Continue medical therapy with aspirin, atorvastatin, Metroprolol and Altace.  2. Hyperlipidemia: On Lipitor 40. Will check fasting lipid panel.  3. Type 2 diabetes: Historically has been diet-controlled. Will check a hemoglobin A1c for reassessment   PLAN  patient appears to be doing well from a cardiovascular standpoint. We will continue his current medications as prescribed. For screening purposes/risk factor modification, we will check a fasting lipid panel as well as a hemoglobin A1c. It has been recommended that he follow-up with his primary cardiologist, Dr. Tresa Sanchez, in 6 months for repeat evaluation.  SIMMONS, BRITTAINYPA-C 09/08/2014 11:49 AM

## 2014-09-15 ENCOUNTER — Other Ambulatory Visit: Payer: Self-pay

## 2014-09-15 MED ORDER — ATORVASTATIN CALCIUM 40 MG PO TABS: 40.0000 mg | ORAL_TABLET | Freq: Every day | ORAL | Status: DC

## 2014-09-15 NOTE — Telephone Encounter (Signed)
Rx sent to pharmacy   

## 2015-02-14 ENCOUNTER — Telehealth: Payer: Self-pay | Admitting: Cardiovascular Disease

## 2015-02-14 DIAGNOSIS — E785 Hyperlipidemia, unspecified: Secondary | ICD-10-CM

## 2015-02-14 DIAGNOSIS — I10 Essential (primary) hypertension: Secondary | ICD-10-CM

## 2015-02-14 DIAGNOSIS — E119 Type 2 diabetes mellitus without complications: Secondary | ICD-10-CM

## 2015-02-14 NOTE — Telephone Encounter (Signed)
Wants to know if Seth Sanchez should have lab work done before coming in to see him . Las saw Dr. [name] in 2014. Please call    Thanks

## 2015-02-14 NOTE — Telephone Encounter (Signed)
Returned call to patient's wife.She stated husband would like to have complete fasting lab work before he sees Dr.Kelly in August.Ad[facility].Order entered.

## 2015-04-10 ENCOUNTER — Other Ambulatory Visit: Payer: Self-pay | Admitting: Cardiology

## 2015-04-27 ENCOUNTER — Ambulatory Visit (INDEPENDENT_AMBULATORY_CARE_PROVIDER_SITE_OTHER): Payer: BLUE CROSS/BLUE SHIELD | Admitting: Cardiovascular Disease

## 2015-04-27 ENCOUNTER — Encounter: Payer: Self-pay | Admitting: Cardiovascular Disease

## 2015-04-27 VITALS — BP 138/78 | HR 55 | Ht 65.0 in | Wt 177.9 lb

## 2015-04-27 DIAGNOSIS — E785 Hyperlipidemia, unspecified: Secondary | ICD-10-CM

## 2015-04-27 DIAGNOSIS — I25810 Atherosclerosis of coronary artery bypass graft(s) without angina pectoris: Secondary | ICD-10-CM

## 2015-04-27 DIAGNOSIS — E8881 Metabolic syndrome: Secondary | ICD-10-CM

## 2015-04-27 DIAGNOSIS — I10 Essential (primary) hypertension: Secondary | ICD-10-CM

## 2015-04-27 NOTE — Patient Instructions (Addendum)
Your physician wants you to follow-up in: 6 months or sooner if needed. You will receive a reminder letter in the mail two months in advance. If you don't receive a letter, please call our office to schedule the follow-up appointment.  Your physician has recommended you make the following change in your medication: decrease the aspirin to 81 mg daily.

## 2015-04-29 ENCOUNTER — Encounter: Payer: Self-pay | Admitting: Cardiovascular Disease

## 2015-04-29 DIAGNOSIS — E8881 Metabolic syndrome: Secondary | ICD-10-CM | POA: Insufficient documentation

## 2015-04-29 DIAGNOSIS — E785 Hyperlipidemia, unspecified: Secondary | ICD-10-CM | POA: Insufficient documentation

## 2015-04-29 NOTE — Progress Notes (Signed)
Patient ID: Seth Sanchez, male   DOB: 10-19-1950, 64 y.o.   MRN: [id number]     HPI: Seth Sanchez is a 64 y.o. male presents to the office for a 22 month cardiology evaluation.  I last saw him in November 2014.  He was seen in 2015 by Seth Doe, PA-C.  [name] has established CAD and underwent initial CABG surgery 1995 with LIMA to the LAD, and vein to the obtuse marginal vessel. In August 2010 he suffered an inferior wall MI and his RCA was occluded prior to a previously placed stent.  He had placement of 3 tandem stents with restoration of normal flow with demonstration of almost complete salvage of myocardium. He developed recurrent episodes of restenosis even despite brachytherapy and on 11/12/2010 underwent redo surgery by Dr. [name] with a  graft to the distal RCA. Peri-peratively he did have insulin resistance and ultimately became overtly diabetic but subsequently with significant dietary adjustments this has essentially normalized.  A nuclear perfusion study in January 2014  continued to show normal perfusion and function. When I saw him in January 2014 an NMR lipoprofile  showed increased LDL particle numbers 1646 and his atorvastatin dose was increased from 20 to 40 mg. I also further try to titrate his Ramapril from 5 to10 mg for optimal blood pressure control.  Since I last saw him, he has continued to do well.  He remains asymptomatic. He is active. He exercises regularly. 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  He now has established primary care with Dr. Renne Crigler since Dr. Alwyn Ren is retiring.  Laboratory in June 2016 was reviewed and at that time his total cholesterol was 181, HDL 49, LDL cholesterol 112, despite taking niacin 1000 mg, atorvastatin 40 mg, and Zetia 10 mg.  Over the last 4 months, he has become a vegetarian and has stopped eating meat.  Repeat blood work on 04/24/2015 now showed total  cholesterol 137, triglycerides 63, HDL 52 and LDL 72.   He denies chest pain.  He denies PND, orthopnea.  He is unaware of palpitations.  Past Medical History  Diagnosis Date  . MI (myocardial infarction)     diaphramatic- Dr. Daphene Jaeger  . Depression   . Vitamin D deficiency   . Diaphragmatic disorder     MI  . CAD (coronary artery disease)   . Dyslipidemia   . Depression 1995    post CABG    Past Surgical History  Procedure Laterality Date  . Cardiac catheterization      x5  . Coronary artery bypass graft  1995    R coronary artery stent  . Coronary artery bypass graft  2013    No Known Allergies  Current Outpatient Prescriptions  Medication Sig Dispense Refill  . aspirin 81 MG tablet Take 81 mg by mouth daily.    [name] atorvastatin (LIPITOR) 40 MG tablet Take 1 tablet (40 mg total) by mouth daily. 30 tablet 7  . Cholecalciferol (VITAMIN D-3) 5000 UNITS TABS Take 1 capsule by mouth daily.     . Coenzyme Q10 (COQ10) 100 MG CAPS Take 1 capsule by mouth daily.     [name] dexlansoprazole (DEXILANT) 60 MG capsule Take 1 capsule (60 mg total) by mouth daily. 10 capsule 0  . finasteride (PROPECIA) 1 MG tablet Take 1 mg by mouth daily.      [name] glucose blood (ONE  TOUCH ULTRA TEST) test strip 1 each by Other route daily. Use as instructed     . metoprolol (LOPRESSOR) 50 MG tablet Take 0.5 tablets (25 mg total) by mouth 2 (two) times daily. 180 tablet 3  . niacin (NIASPAN) 1000 MG CR tablet Take 1,000 mg by mouth at bedtime.      . nitroGLYCERIN (NITROSTAT) 0.4 MG SL tablet Place 1 tablet (0.4 mg total) under the tongue every 5 (five) minutes as needed for chest pain. 25 tablet 3  . ONE TOUCH LANCETS MISC by Does not apply route daily.      . ramipril (ALTACE) 5 MG capsule TAKE 1 CAPSULE (5 MG TOTAL) BY MOUTH DAILY. 90 capsule 0  . ZETIA 10 MG tablet Take 10 mg by mouth daily.  12   No current facility-administered medications for this visit.    History   Social History  . Marital  Status: Married    Spouse Name: N/A  . Number of Children: N/A  . Years of Education: N/A   Occupational History  . SELF EMPLOYED    Social History Main Topics  . Smoking status: Former Smoker    Types: Cigarettes    Quit date: 07/30/2007  . Smokeless tobacco: Former Neurosurgeon    Quit date: 09/23/2006     Comment: smoked 1980-1995; smoked 2005-2008. Maximum of 1.5 ppd  . Alcohol Use: No  . Drug Use: No  . Sexual Activity: Not on file   Other Topics Concern  . Not on file   Social History Narrative   NO REG EXERCISE          Family History  Problem Relation Age of Onset  . Diabetes Mother   . Lung cancer Father     ex smoker  . Heart disease Neg Hx   . Stroke Neg Hx    Socially he is remarried. There is no recent tobacco or alcohol use.  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  ROS General: Negative; No fevers, chills, or night sweats;  HEENT: Negative; No changes in vision or hearing, sinus congestion, difficulty swallowing Pulmonary: Negative; No cough, wheezing, shortness of breath, hemoptysis Cardiovascular: Negative; No chest pain, presyncope, syncope, palpitations GI: Negative; No nausea, vomiting, diarrhea, or abdominal pain GU: Negative; No dysuria, hematuria, or difficulty voiding Musculoskeletal: Negative; no myalgias, joint pain, or weakness Hematologic/Oncology: Negative; no easy bruising, bleeding Endocrine: Negative; no heat/cold intolerance; previously he had metabolic syndrome and mild diabetes but this has normalized with diet. Neuro: Negative; no changes in balance, headaches Skin: Negative; No rashes or skin lesions Psychiatric: Negative; No behavioral problems, depression Sleep: Negative; No snoring, daytime sleepiness, hypersomnolence, bruxism, restless legs, hypnogognic hallucinations, no cataplexy Other comprehensive 14 point system review is negative.  PE BP 138/78 mmHg  Pulse 55  Ht 5'  5" (1.651 m)  Wt 177 lb 14.4 oz (80.695 kg)  BMI 29.60 kg/m2  Wt Readings from Last 3 Encounters:  04/27/15 177 lb 14.4 oz (80.695 kg)  09/08/14 178 lb 1.6 oz (80.786 kg)  02/09/14 181 lb (82.101 kg)   General: Alert, oriented, no distress.  Skin: normal turgor, no rashes HEENT: Normocephalic, atraumatic. Pupils round and reactive; sclera anicteric;no lid lag.  Nose without nasal septal hypertrophy Mouth/Parynx benign; Mallinpatti scale 2 Neck: No JVD, no carotid bruits; normal carotid upstroke Lungs: clear to ausculatation and percussion; no wheezing or rales Chest wall: Nontender to palpation Heart: RRR,  s1 s2 normal 1/6 systolic murmur; no S3 or S4 gallop.  No diastolic murmurs rubs thrills or heaves. Abdomen: soft, nontender; no hepatosplenomehaly, BS+; abdominal aorta nontender and not dilated by palpation. Back: No CVA tenderness Pulses 2+ Extremities: no clubbing cyanosis or edema, Homan's sign negative  Neurologic: grossly nonfocal Psychologic: normal affect and mood.  ECG (independently read by me): Sinus bradycardia 55 bpm.  No ectopy.  Normal intervals.  November 2014 ECG: Normal sinus rhythm at 63 beats per minute. QTc interval 43 ms. Her restoration of normal  LABS:  BMET    Component Value Date/Time   NA 139 01/28/2014 1540   K 4.2 01/28/2014 1540   CL 104 01/28/2014 1540   CO2 28 01/28/2014 1540   GLUCOSE 99 01/28/2014 1540   BUN 20 01/28/2014 1540   CREATININE 0.9 01/28/2014 1540   CALCIUM 9.4 01/28/2014 1540   GFRNONAA 88* 10/04/2012 1840   GFRAA >90 10/04/2012 1840     Hepatic Function Panel     Component Value Date/Time   PROT 6.8 01/28/2014 1540   ALBUMIN 4.0 01/28/2014 1540   AST 24 01/28/2014 1540   ALT 25 01/28/2014 1540   ALKPHOS 45 01/28/2014 1540   BILITOT 0.7 01/28/2014 1540   BILIDIR 0.1 01/28/2014 1540     CBC    Component Value Date/Time   WBC 7.3 01/28/2014 1540   RBC 4.95 01/28/2014 1540   HGB 14.7 01/28/2014 1540   HCT  43.2 01/28/2014 1540   PLT 229.0 01/28/2014 1540   MCV 87.3 01/28/2014 1540   MCH 29.4 10/04/2012 1840   MCHC 34.0 01/28/2014 1540   RDW 12.9 01/28/2014 1540   LYMPHSABS 1.4 01/28/2014 1540   MONOABS 0.8 01/28/2014 1540   EOSABS 0.2 01/28/2014 1540   BASOSABS 0.0 01/28/2014 1540     BNP    Component Value Date/Time   PROBNP 94.8 10/04/2012 1840    Lipid Panel     Component Value Date/Time   CHOL  06/13/2010 0430    119        ATP III CLASSIFICATION:  <200     mg/dL   Desirable  409-811  mg/dL   Borderline High  >=914    mg/dL   High          TRIG 40 06/13/2010 0430   HDL 52 06/13/2010 0430   CHOLHDL 2.3 06/13/2010 0430   VLDL 8 06/13/2010 0430   LDLCALC  06/13/2010 0430    59        Total Cholesterol/HDL:CHD Risk Coronary Heart Disease Risk Table                     Men   Women  1/2 Average Risk   3.4   3.3  Average Risk       5.0   4.4  2 X Average Risk   9.6   7.1  3 X Average Risk  23.4   11.0        Use the calculated Patient Ratio above and the CHD Risk Table to determine the patient's CHD Risk.        ATP III CLASSIFICATION (LDL):  <100     mg/dL   Optimal  782-956  mg/dL   Near or Above                    Optimal  130-159  mg/dL   Borderline  213-086  mg/dL   High  >578  mg/dL   Very High     RADIOLOGY: No results found.    ASSESSMENT AND PLAN: Mr. Lonnie Reth is now 64 years old and is status post initial CABG revascularization surgery in 1995, an inferior wall myocardial infarction in August 2010, treated with successful RCA intervention, but due to recurrent restenosis issues required redo surgery in February 2012.  His last nuclear perfusion study in 2014 was normal.  He remains asymptomatic.  His blood pressure today is controlled on ramipril 5 mg daily, metoprolol 25 mg twice a day.  His lipid studies have significantly improved on Zetia 10 mg, niacin 1000 g, and atorvastatin 40 mg but LDL was still elevated several months ago at 112.    With his change to a vegetarian diet he now is at target.  I had discussed the possible need for PCSK9 therapy if on maximal therapy he was unable to achieve target.  I recommended he reduce his aspirin from 325 mg to 81 mg daily.  I reviewed the recent laboratory done by Dr. Renne Crigler, both in August and in June 2016.  Renal function was normal with a creatinine of 0.8.  Glucose was mildly increased at 116.  He did have metabolic syndrome and in the past had transiently become overtly diabetic , which dramatically improved with dietary adjustment.  I will see him in 6 months for cardiology evaluation.  Time spent: 25 minutes   Lennette Bihari, MD, [facility]  04/29/2015 9:12 AM

## 2015-05-08 ENCOUNTER — Encounter: Payer: Self-pay | Admitting: Cardiovascular Disease

## 2015-05-08 ENCOUNTER — Other Ambulatory Visit: Payer: Self-pay | Admitting: Cardiology

## 2015-05-08 NOTE — Telephone Encounter (Signed)
Rx(s) sent to pharmacy electronically.  

## 2015-07-17 ENCOUNTER — Other Ambulatory Visit: Payer: Self-pay | Admitting: Cardiovascular Disease

## 2015-07-25 ENCOUNTER — Other Ambulatory Visit: Payer: Self-pay

## 2015-07-25 MED ORDER — ATORVASTATIN CALCIUM 40 MG PO TABS: 40.0000 mg | ORAL_TABLET | Freq: Every day | ORAL | Status: DC

## 2015-08-07 ENCOUNTER — Telehealth: Payer: Self-pay | Admitting: Cardiovascular Disease

## 2015-08-07 ENCOUNTER — Encounter (HOSPITAL_COMMUNITY): Payer: Self-pay | Admitting: *Deleted

## 2015-08-07 ENCOUNTER — Emergency Department (HOSPITAL_COMMUNITY): Payer: BLUE CROSS/BLUE SHIELD

## 2015-08-07 ENCOUNTER — Inpatient Hospital Stay (HOSPITAL_COMMUNITY)
Admission: EM | Admit: 2015-08-07 | Discharge: 2015-08-08 | DRG: 287 | Disposition: A | Discharge status: Home / Self Care | Payer: BLUE CROSS/BLUE SHIELD | Attending: Cardiology | Admitting: Cardiology

## 2015-08-07 DIAGNOSIS — R079 Chest pain, unspecified: Secondary | ICD-10-CM

## 2015-08-07 DIAGNOSIS — I25710 Atherosclerosis of autologous vein coronary artery bypass graft(s) with unstable angina pectoris: Secondary | ICD-10-CM | POA: Diagnosis present

## 2015-08-07 DIAGNOSIS — Z955 Presence of coronary angioplasty implant and graft: Secondary | ICD-10-CM

## 2015-08-07 DIAGNOSIS — Z7982 Long term (current) use of aspirin: Secondary | ICD-10-CM

## 2015-08-07 DIAGNOSIS — I25110 Atherosclerotic heart disease of native coronary artery with unstable angina pectoris: Secondary | ICD-10-CM | POA: Diagnosis present

## 2015-08-07 DIAGNOSIS — E119 Type 2 diabetes mellitus without complications: Secondary | ICD-10-CM

## 2015-08-07 DIAGNOSIS — I10 Essential (primary) hypertension: Secondary | ICD-10-CM | POA: Diagnosis present

## 2015-08-07 DIAGNOSIS — Z833 Family history of diabetes mellitus: Secondary | ICD-10-CM | POA: Diagnosis not present

## 2015-08-07 DIAGNOSIS — I2510 Atherosclerotic heart disease of native coronary artery without angina pectoris: Secondary | ICD-10-CM | POA: Diagnosis present

## 2015-08-07 DIAGNOSIS — Z87891 Personal history of nicotine dependence: Secondary | ICD-10-CM | POA: Diagnosis not present

## 2015-08-07 DIAGNOSIS — I25700 Atherosclerosis of coronary artery bypass graft(s), unspecified, with unstable angina pectoris: Secondary | ICD-10-CM | POA: Diagnosis present

## 2015-08-07 DIAGNOSIS — Z801 Family history of malignant neoplasm of trachea, bronchus and lung: Secondary | ICD-10-CM

## 2015-08-07 DIAGNOSIS — Z951 Presence of aortocoronary bypass graft: Secondary | ICD-10-CM | POA: Diagnosis not present

## 2015-08-07 DIAGNOSIS — I2582 Chronic total occlusion of coronary artery: Secondary | ICD-10-CM | POA: Diagnosis present

## 2015-08-07 DIAGNOSIS — I252 Old myocardial infarction: Secondary | ICD-10-CM

## 2015-08-07 DIAGNOSIS — I200 Unstable angina: Secondary | ICD-10-CM | POA: Insufficient documentation

## 2015-08-07 DIAGNOSIS — E785 Hyperlipidemia, unspecified: Secondary | ICD-10-CM | POA: Diagnosis present

## 2015-08-07 DIAGNOSIS — E559 Vitamin D deficiency, unspecified: Secondary | ICD-10-CM | POA: Diagnosis present

## 2015-08-07 LAB — COMPREHENSIVE METABOLIC PANEL
ALBUMIN: 4 g/dL (ref 3.5–5.0)
ALK PHOS: 49 U/L (ref 38–126)
ALT: 50 U/L (ref 17–63)
AST: 40 U/L (ref 15–41)
Anion gap: 11 (ref 5–15)
BILIRUBIN TOTAL: 0.8 mg/dL (ref 0.3–1.2)
BUN: 9 mg/dL (ref 6–20)
CALCIUM: 9.4 mg/dL (ref 8.9–10.3)
CO2: 22 mmol/L (ref 22–32)
Chloride: 105 mmol/L (ref 101–111)
Creatinine, Ser: 0.76 mg/dL (ref 0.61–1.24)
GFR calc Af Amer: >60 mL/min (ref >60)
GLUCOSE: 101 mg/dL — AB (ref 65–99)
POTASSIUM: 4.1 mmol/L (ref 3.5–5.1)
Sodium: 138 mmol/L (ref 135–145)
TOTAL PROTEIN: 6.4 g/dL — AB (ref 6.5–8.1)

## 2015-08-07 LAB — CBC WITH DIFFERENTIAL/PLATELET
BASOS PCT: 0 %
Basophils Absolute: 0 10*3/uL (ref 0.0–0.1)
EOS PCT: 3 %
Eosinophils Absolute: 0.2 10*3/uL (ref 0.0–0.7)
HCT: 45.3 % (ref 39.0–52.0)
Hemoglobin: 15.8 g/dL (ref 13.0–17.0)
Lymphocytes Relative: 19 %
Lymphs Abs: 1.2 10*3/uL (ref 0.7–4.0)
MCH: 30.3 pg (ref 26.0–34.0)
MCHC: 34.9 g/dL (ref 30.0–36.0)
MCV: 86.8 fL (ref 78.0–100.0)
MONO ABS: 0.9 10*3/uL (ref 0.1–1.0)
Monocytes Relative: 14 %
Neutro Abs: 4 10*3/uL (ref 1.7–7.7)
Neutrophils Relative %: 64 %
Platelets: 172 10*3/uL (ref 150–400)
RBC: 5.22 MIL/uL (ref 4.22–5.81)
RDW: 12.6 % (ref 11.5–15.5)
WBC: 6.3 10*3/uL (ref 4.0–10.5)

## 2015-08-07 LAB — CBG MONITORING, ED: GLUCOSE-CAPILLARY: 152 mg/dL — AB (ref 65–99)

## 2015-08-07 LAB — TROPONIN I
Troponin I: <0.03 ng/mL (ref <0.031)
Troponin I: <0.03 ng/mL (ref <0.031)

## 2015-08-07 LAB — I-STAT TROPONIN, ED: TROPONIN I, POC: 0.01 ng/mL (ref 0.00–0.08)

## 2015-08-07 LAB — HEPARIN LEVEL (UNFRACTIONATED): Heparin Unfractionated: 0.56 IU/mL (ref 0.30–0.70)

## 2015-08-07 MED ORDER — EZETIMIBE 10 MG PO TABS
10.0000 mg | ORAL_TABLET | Freq: Every day | ORAL | Status: DC
  Administered 2015-08-08: 10 mg via ORAL
  Filled 2015-08-07: qty 1

## 2015-08-07 MED ORDER — FINASTERIDE 1 MG PO TABS: 1.0000 mg | ORAL_TABLET | Freq: Every day | ORAL | Status: DC

## 2015-08-07 MED ORDER — INFLUENZA VAC SPLIT QUAD 0.5 ML IM SUSY: 0.5000 mL | PREFILLED_SYRINGE | INTRAMUSCULAR | Status: DC | PRN

## 2015-08-07 MED ORDER — ATORVASTATIN CALCIUM 40 MG PO TABS
40.0000 mg | ORAL_TABLET | Freq: Every day | ORAL | Status: DC
  Administered 2015-08-07: 40 mg via ORAL
  Filled 2015-08-07: qty 1

## 2015-08-07 MED ORDER — RAMIPRIL 5 MG PO CAPS
5.0000 mg | ORAL_CAPSULE | Freq: Every day | ORAL | Status: DC
  Administered 2015-08-08: 5 mg via ORAL
  Filled 2015-08-07: qty 1

## 2015-08-07 MED ORDER — SODIUM CHLORIDE 0.9 % IV SOLN: 250.0000 mL | INTRAVENOUS | Status: DC | PRN

## 2015-08-07 MED ORDER — ACETAMINOPHEN 325 MG PO TABS: 650.0000 mg | ORAL_TABLET | ORAL | Status: DC | PRN

## 2015-08-07 MED ORDER — ASPIRIN 81 MG PO CHEW
81.0000 mg | CHEWABLE_TABLET | ORAL | Status: AC
  Administered 2015-08-08: 81 mg via ORAL
  Filled 2015-08-07: qty 1

## 2015-08-07 MED ORDER — ASPIRIN EC 81 MG PO TBEC
81.0000 mg | DELAYED_RELEASE_TABLET | Freq: Every day | ORAL | Status: DC
  Filled 2015-08-07: qty 1

## 2015-08-07 MED ORDER — NITROGLYCERIN 0.4 MG SL SUBL: 0.4000 mg | SUBLINGUAL_TABLET | SUBLINGUAL | Status: DC | PRN

## 2015-08-07 MED ORDER — HEPARIN BOLUS VIA INFUSION
4000.0000 [IU] | Freq: Once | INTRAVENOUS | Status: AC
  Administered 2015-08-07: 4000 [IU] via INTRAVENOUS
  Filled 2015-08-07: qty 4000

## 2015-08-07 MED ORDER — SODIUM CHLORIDE 0.9 % WEIGHT BASED INFUSION: 1.0000 mL/kg/h | INTRAVENOUS | Status: DC

## 2015-08-07 MED ORDER — SODIUM CHLORIDE 0.9 % IJ SOLN: 3.0000 mL | INTRAMUSCULAR | Status: DC | PRN

## 2015-08-07 MED ORDER — ASPIRIN 81 MG PO TABS: 81.0000 mg | ORAL_TABLET | Freq: Every day | ORAL | Status: DC

## 2015-08-07 MED ORDER — FINASTERIDE 1 MG PO TABS
1.0000 mg | ORAL_TABLET | Freq: Every day | ORAL | Status: DC
  Filled 2015-08-07: qty 1

## 2015-08-07 MED ORDER — SODIUM CHLORIDE 0.9 % IJ SOLN: 3.0000 mL | Freq: Two times a day (BID) | INTRAMUSCULAR | Status: DC

## 2015-08-07 MED ORDER — ONDANSETRON HCL 4 MG/2ML IJ SOLN: 4.0000 mg | Freq: Four times a day (QID) | INTRAMUSCULAR | Status: DC | PRN

## 2015-08-07 MED ORDER — AMOXICILLIN-POT CLAVULANATE 875-125 MG PO TABS
1.0000 | ORAL_TABLET | Freq: Two times a day (BID) | ORAL | Status: DC
  Administered 2015-08-07 – 2015-08-08 (×2): 1 via ORAL
  Filled 2015-08-07 (×2): qty 1

## 2015-08-07 MED ORDER — SODIUM CHLORIDE 0.9 % WEIGHT BASED INFUSION
3.0000 mL/kg/h | INTRAVENOUS | Status: DC
  Administered 2015-08-08: 3 mL/kg/h via INTRAVENOUS

## 2015-08-07 MED ORDER — METOPROLOL TARTRATE 25 MG PO TABS
25.0000 mg | ORAL_TABLET | Freq: Two times a day (BID) | ORAL | Status: DC
  Administered 2015-08-07 – 2015-08-08 (×2): 25 mg via ORAL
  Filled 2015-08-07 (×2): qty 1

## 2015-08-07 MED ORDER — HEPARIN (PORCINE) IN NACL 100-0.45 UNIT/ML-% IJ SOLN
950.0000 [IU]/h | INTRAMUSCULAR | Status: DC
  Administered 2015-08-07: 950 [IU]/h via INTRAVENOUS
  Filled 2015-08-07: qty 250

## 2015-08-07 NOTE — Progress Notes (Signed)
ANTICOAGULATION CONSULT NOTE - Follow Up Consult  Pharmacy Consult for heparin Indication: CP/ACS  No Known Allergies  Patient Measurements: Height: 5\' 5"  (165.1 cm) Weight: 171 lb 4.8 oz (77.7 kg) IBW/kg (Calculated) : 61.5 Heparin Dosing Weight: 77.8 kg  Vital Signs: Temp: 97.8 F (36.6 C) (11/14 1641) Temp Source: Oral (11/14 1641) BP: 142/77 mmHg (11/14 1641) Pulse Rate: 64 (11/14 1641)  Labs:  Recent Labs  08/07/15 1058 08/07/15 1508 08/07/15 2033  HGB 15.8  --   --   HCT 45.3  --   --   PLT 172  --   --   HEPARINUNFRC  --   --  0.56  CREATININE 0.76  --   --   TROPONINI  --  <0.03  --     Estimated Creatinine Clearance: 90.9 mL/min (by C-G formula based on Cr of 0.76).   Medical History: Past Medical History  Diagnosis Date  . MI (myocardial infarction) (HCC)     diaphramatic- Dr. Daphene Jaegerom Kelly  . Depression   . Vitamin D deficiency   . Diaphragmatic disorder     MI  . CAD (coronary artery disease)     a. LIMA-LAD, SVG-OM (known to be occluded), SVG-RCA in 1995 b. redo SVG-RCA in 2012  . Dyslipidemia   . Depression 1995    post CABG    Assessment: [age] yom with CAD hx s/p multiple interventions presenting with CP. Pharmacy consulted to dose heparin for CP/ACS/UA (no anticoag pta). CBC wnl. No bleed documented. Heparin drip 950 uts/hr HL 0.56 at goal.    Goal of Therapy:  Heparin level 0.3-0.7 units/ml Monitor platelets by anticoagulation protocol: Yes   Plan:   Continue Heparin @ 950 units/h Daily HL/CBC Mon s/sx bleeding   Leota SauersLisa Janace Decker Pharm.D. CPP, BCPS Clinical Pharmacist [phone] 08/07/2015 9:23 PM

## 2015-08-07 NOTE — H&P (Signed)
HISTORY AND PHYSICAL    Patient ID: Seth DerryJohn Colen Cullom MRN: [id number], DOB/AGE: 1951/07/20   Admit date: 08/07/2015  Primary Physician: Londell MohPHARR,WALTER DAVIDSON, MD Primary Cardiologist: Dr. [name]  HPI: Seth Sanchez is a 64 y.o. male with past medical history of CAD (s/p CABG 1995 LIMA-LAD, SVG-OM (known to be occluded), SVG-RCA, with redo SVG-RCA in 2012), HTN, and HLD who presents to [facility] on 08/07[facility]2015 for evaluation of chest pain.  The patient reports he was driving to the office this morning when he developed a sternal chest pressure which he says was a 3/10 in intensity. He reports this sensation is similar to his previous episodes of unstable angina for which he has required several catheterizations. He reports having repeat episodes while in the ED, saying the pressure only lasts several minutes then resolves. He denies any associated nausea, vomiting, radiating pain, or diaphoresis. Last cath was in 2012 during which he had in-stent restenosis of the RCA and single vessel bypass to the RCA was recommended. He denies any anginal symptoms since until the recurrence this morning.  He reports working outside yesterday, carrying bales of hay, during which he developed shortness of breath. He denies having any chest pain at that time. He does report being diagnosed with a cold two weeks ago and is currently on antibiotic therapy. He reports generalized fatigue for the past week and increased dyspnea, but thought it was likely secondary to his cold.  While in the ED, his initial troponin has been negative. EKG shows sinus bradycardia with rate in 50's. No acute ST or T-wave changes.   His last Myoview was in January 2014 and showed no evidence of ischemia at that time. He was last seen by Dr. [name] in 04/2015 and doing well at that time.  Problem List  Past Medical History  Diagnosis Date  . MI (myocardial infarction) (HCC)     diaphramatic- Dr. Daphene Jaegerom Kelly  . Depression   .  Vitamin D deficiency   . Diaphragmatic disorder     MI  . CAD (coronary artery disease)     a. LIMA-LAD, SVG-OM (known to be occluded), SVG-RCA in 1995 b. redo SVG-RCA in 2012  . Dyslipidemia   . Depression 1995    post CABG    Past Surgical History  Procedure Laterality Date  . Cardiac catheterization      x5  . Coronary artery bypass graft  1995    R coronary artery stent  . Coronary artery bypass graft  2013    Allergies No Known Allergies  Family History Family History  Problem Relation Age of Onset  . Diabetes Mother   . Lung cancer Father     ex smoker  . Heart disease Neg Hx   . Stroke Neg Hx     Social History Social History   Social History  . Marital Status: Married    Spouse Name: N/A  . Number of Children: N/A  . Years of Education: N/A   Occupational History  . SELF EMPLOYED    Social History Main Topics  . Smoking status: Former Smoker    Types: Cigarettes    Quit date: 07/30/2007  . Smokeless tobacco: Former NeurosurgeonUser    Quit date: 09/23/2006     Comment: smoked 1980-1995; smoked 2005-2008. Maximum of 1.5 ppd  . Alcohol Use: No  . Drug Use: No  . Sexual Activity: Not on file   Other Topics Concern  . Not on file  Social History Narrative   NO REG EXERCISE           Current facility-administered medications:  .  heparin ADULT infusion 100 units/mL (25000 units/250 mL), 950 Units/hr, Intravenous, Continuous, Almon Hercules, RPH .  heparin bolus via infusion 4,000 Units, 4,000 Units, Intravenous, Once, Almon Hercules, [facility]  Current outpatient prescriptions:  .  amoxicillin-clavulanate (AUGMENTIN) 875-125 MG tablet, Take 1 tablet by mouth 2 (two) times daily. Started on 08-01-15 for 10 days, Disp: , Rfl: 0 .  aspirin 81 MG tablet, Take 81 mg by mouth daily., Disp: , Rfl:  .  atorvastatin (LIPITOR) 40 MG tablet, Take 1 tablet (40 mg total) by mouth daily., Disp: 90 tablet, Rfl: 3 .  Cholecalciferol (VITAMIN D-3) 5000 UNITS TABS, Take 1 capsule by  mouth daily. , Disp: , Rfl:  .  Coenzyme Q10 (COQ10) 100 MG CAPS, Take 1 capsule by mouth daily. , Disp: , Rfl:  .  finasteride (PROPECIA) 1 MG tablet, Take 1 mg by mouth daily.  , Disp: , Rfl:  .  glucose blood (ONE TOUCH ULTRA TEST) test strip, 1 each by Other route daily. Use as instructed , Disp: , Rfl:  .  metoprolol (LOPRESSOR) 50 MG tablet, TAKE 0.5 TABLETS (25 MG TOTAL) BY MOUTH 2 (TWO) TIMES DAILY., Disp: 180 tablet, Rfl: 1 .  Multiple Vitamins-Minerals (EYE VITAMINS PO), Take 1 tablet by mouth daily., Disp: , Rfl:  .  niacin (NIASPAN) 1000 MG CR tablet, Take 1,000 mg by mouth at bedtime.  , Disp: , Rfl:  .  ONE TOUCH LANCETS MISC, by Does not apply route daily.  , Disp: , Rfl:  .  ramipril (ALTACE) 5 MG capsule, TAKE 1 CAPSULE (5 MG TOTAL) BY MOUTH DAILY., Disp: 90 capsule, Rfl: 2 .  ZETIA 10 MG tablet, Take 10 mg by mouth daily., Disp: , Rfl: 12 .  nitroGLYCERIN (NITROSTAT) 0.4 MG SL tablet, Place 1 tablet (0.4 mg total) under the tongue every 5 (five) minutes as needed for chest pain., Disp: 25 tablet, Rfl: 3   Review of Systems General:  No chills, fever, night sweats or weight changes. Positive for generalized fatigue. Cardiovascular:  No edema, orthopnea, palpitations, paroxysmal nocturnal dyspnea. Positive for chest pain and dyspnea on exertion. Dermatological: No rash, lesions/masses Respiratory: No dyspnea at rest. Positive for cough. Urologic: No hematuria, dysuria Abdominal:   No nausea, vomiting, diarrhea, bright red blood per rectum, melena, or hematemesis Neurologic:  No visual changes, wkns, changes in mental status. All other systems reviewed and are otherwise negative except as noted above.  Physical Exam Blood pressure 125/57, pulse 61, temperature 98.6 F (37 C), temperature source Oral, resp. rate 8, SpO2 95 %.  General: Pleasant, Caucasian male in NAD Psych: Normal affect. Neuro: Alert and oriented X 3. Moves all extremities spontaneously. HEENT:  Normal  Neck: Supple without bruits or JVD. Lungs:  Resp regular and unlabored, CTA without wheezing or rales. Heart: RRR no s3, s4, or murmurs. Abdomen: Soft, non-tender, non-distended, BS + x 4.  Extremities: No clubbing, cyanosis or edema. DP/PT/Radials 2+ and equal bilaterally.  Labs  No results for input(s): CKTOTAL, CKMB, TROPONINI in the last 72 hours. Lab Results  Component Value Date   WBC 6.3 08/07/2015   HGB 15.8 08/07/2015   HCT 45.3 08/07/2015   MCV 86.8 08/07/2015   PLT 172 08/07/2015     Recent Labs Lab 08/07/15 1058  NA 138  K 4.1  CL 105  CO2 22  BUN 9  CREATININE 0.76  CALCIUM 9.4  PROT 6.4*  BILITOT 0.8  ALKPHOS 49  ALT 50  AST 40  GLUCOSE 101*   Radiology/Studies Dg Chest 2 View: 08/07/2015  CLINICAL DATA:  64-year-old male with a history of mid chest pain EXAM: CHEST - 2 VIEW COMPARISON:  10/04/2012 FINDINGS: Cardiomediastinal silhouette unchanged in size and contour. Surgical changes of prior median sternotomy and CABG. No evidence of pulmonary vascular congestion. No confluent airspace disease.  No pneumothorax or pleural effusion. No displaced fracture. Unremarkable appearance of the upper abdomen. IMPRESSION: No radiographic evidence of acute cardiopulmonary disease. Prior CABG. Signed, Jaime S. Wagner, DO [facility] Electronically Signed   By: Jaime  Wagner D.O.   On: 08/07/2015 12:21   ECG: Sinus bradycardia with rate in 50's. No acute ST or T-wave changes.   ECHOCARDIOGRAM: None on File   ASSESSMENT AND PLAN  1. Unstable Angina - history of CAD (s/p CABG 1995 w/ LIMA-LAD, SVG-OM (known to be occluded), SVG-RCA, with redo SVG-RCA in 2012 - initial troponin negative. EKG without acute changes. Will cycle cardiac enzymes. - start Heparin. Continue ASA, statin, BB, and ACE-I.  - plan for LHC in the AM by Dr. Kelly. NPO after midnight.  2. HTN - continue Metoprolol and  Ramipril.  3. HLD - continue statin and Zetia.  Signed, Brittany M Strader, PA-C 08/07/2015, 2:20 PM Pager: [phone]  The patient was seen, examined and discussed with Laura Ingold, NP and I agree with the above.   64 year old male with known h/o CAD, CABG in  1995 (at age 43) and redo single vessel BP to RCA in 2012. He presents with a chest pain that is typical for him but significantly less intense than in the past. Its retrosternal burning like with nausea, SOB and feeling of "dooming". He also felt retrosternal exertional chest pain yesterday. The initial troponin is negative x 1 and his ECG is unchanged. We will start heparin drip for UA, continue ASA, metoprolol, atorvastatin, zetia, ramipril. Based on troponin trend/ECG changes/symptoms overnight we will decide for cath vs a stress test.   Anahlia Iseminger H 08/07/2015  

## 2015-08-07 NOTE — Consult Note (Addendum)
HISTORY AND PHYSICAL    Patient ID: Seth Sanchez MRN: [id number], DOB/AGE: 1951/07/20   Admit date: 08/07/2015  Primary Physician: Londell MohPHARR,WALTER DAVIDSON, MD Primary Cardiologist: Dr. [name]  HPI: Seth Sanchez is a 64 y.o. male with past medical history of CAD (s/p CABG 1995 LIMA-LAD, SVG-OM (known to be occluded), SVG-RCA, with redo SVG-RCA in 2012), HTN, and HLD who presents to [facility] on 08/07/2015 for evaluation of chest pain.  The patient reports he was driving to the office this morning when he developed a sternal chest pressure which he says was a 3/10 in intensity. He reports this sensation is similar to his previous episodes of unstable angina for which he has required several catheterizations. He reports having repeat episodes while in the ED, saying the pressure only lasts several minutes then resolves. He denies any associated nausea, vomiting, radiating pain, or diaphoresis. Last cath was in 2012 during which he had in-stent restenosis of the RCA and single vessel bypass to the RCA was recommended. He denies any anginal symptoms since until the recurrence this morning.  He reports working outside yesterday, carrying bales of hay, during which he developed shortness of breath. He denies having any chest pain at that time. He does report being diagnosed with a cold two weeks ago and is currently on antibiotic therapy. He reports generalized fatigue for the past week and increased dyspnea, but thought it was likely secondary to his cold.  While in the ED, his initial troponin has been negative. EKG shows sinus bradycardia with rate in 50's. No acute ST or T-wave changes.   His last Myoview was in January 2014 and showed no evidence of ischemia at that time. He was last seen by Dr. [name] in 04/2015 and doing well at that time.  Problem List  Past Medical History  Diagnosis Date  . MI (myocardial infarction) (HCC)     diaphramatic- Dr. Daphene Jaegerom Kelly  . Depression   .  Vitamin D deficiency   . Diaphragmatic disorder     MI  . CAD (coronary artery disease)     a. LIMA-LAD, SVG-OM (known to be occluded), SVG-RCA in 1995 b. redo SVG-RCA in 2012  . Dyslipidemia   . Depression 1995    post CABG    Past Surgical History  Procedure Laterality Date  . Cardiac catheterization      x5  . Coronary artery bypass graft  1995    R coronary artery stent  . Coronary artery bypass graft  2013    Allergies No Known Allergies  Family History Family History  Problem Relation Age of Onset  . Diabetes Mother   . Lung cancer Father     ex smoker  . Heart disease Neg Hx   . Stroke Neg Hx     Social History Social History   Social History  . Marital Status: Married    Spouse Name: N/A  . Number of Children: N/A  . Years of Education: N/A   Occupational History  . SELF EMPLOYED    Social History Main Topics  . Smoking status: Former Smoker    Types: Cigarettes    Quit date: 07/30/2007  . Smokeless tobacco: Former NeurosurgeonUser    Quit date: 09/23/2006     Comment: smoked 1980-1995; smoked 2005-2008. Maximum of 1.5 ppd  . Alcohol Use: No  . Drug Use: No  . Sexual Activity: Not on file   Other Topics Concern  . Not on file  Social History Narrative   NO REG EXERCISE           Current facility-administered medications:  .  heparin ADULT infusion 100 units/mL (25000 units/250 mL), 950 Units/hr, Intravenous, Continuous, Almon Hercules, RPH .  heparin bolus via infusion 4,000 Units, 4,000 Units, Intravenous, Once, Almon Hercules, [facility]  Current outpatient prescriptions:  .  amoxicillin-clavulanate (AUGMENTIN) 875-125 MG tablet, Take 1 tablet by mouth 2 (two) times daily. Started on 08-01-15 for 10 days, Disp: , Rfl: 0 .  aspirin 81 MG tablet, Take 81 mg by mouth daily., Disp: , Rfl:  .  atorvastatin (LIPITOR) 40 MG tablet, Take 1 tablet (40 mg total) by mouth daily., Disp: 90 tablet, Rfl: 3 .  Cholecalciferol (VITAMIN D-3) 5000 UNITS TABS, Take 1 capsule by  mouth daily. , Disp: , Rfl:  .  Coenzyme Q10 (COQ10) 100 MG CAPS, Take 1 capsule by mouth daily. , Disp: , Rfl:  .  finasteride (PROPECIA) 1 MG tablet, Take 1 mg by mouth daily.  , Disp: , Rfl:  .  glucose blood (ONE TOUCH ULTRA TEST) test strip, 1 each by Other route daily. Use as instructed , Disp: , Rfl:  .  metoprolol (LOPRESSOR) 50 MG tablet, TAKE 0.5 TABLETS (25 MG TOTAL) BY MOUTH 2 (TWO) TIMES DAILY., Disp: 180 tablet, Rfl: 1 .  Multiple Vitamins-Minerals (EYE VITAMINS PO), Take 1 tablet by mouth daily., Disp: , Rfl:  .  niacin (NIASPAN) 1000 MG CR tablet, Take 1,000 mg by mouth at bedtime.  , Disp: , Rfl:  .  ONE TOUCH LANCETS MISC, by Does not apply route daily.  , Disp: , Rfl:  .  ramipril (ALTACE) 5 MG capsule, TAKE 1 CAPSULE (5 MG TOTAL) BY MOUTH DAILY., Disp: 90 capsule, Rfl: 2 .  ZETIA 10 MG tablet, Take 10 mg by mouth daily., Disp: , Rfl: 12 .  nitroGLYCERIN (NITROSTAT) 0.4 MG SL tablet, Place 1 tablet (0.4 mg total) under the tongue every 5 (five) minutes as needed for chest pain., Disp: 25 tablet, Rfl: 3   Review of Systems General:  No chills, fever, night sweats or weight changes. Positive for generalized fatigue. Cardiovascular:  No edema, orthopnea, palpitations, paroxysmal nocturnal dyspnea. Positive for chest pain and dyspnea on exertion. Dermatological: No rash, lesions/masses Respiratory: No dyspnea at rest. Positive for cough. Urologic: No hematuria, dysuria Abdominal:   No nausea, vomiting, diarrhea, bright red blood per rectum, melena, or hematemesis Neurologic:  No visual changes, wkns, changes in mental status. All other systems reviewed and are otherwise negative except as noted above.  Physical Exam Blood pressure 125/57, pulse 61, temperature 98.6 F (37 C), temperature source Oral, resp. rate 8, SpO2 95 %.  General: Pleasant, Caucasian male in NAD Psych: Normal affect. Neuro: Alert and oriented X 3. Moves all extremities spontaneously. HEENT:  Normal  Neck: Supple without bruits or JVD. Lungs:  Resp regular and unlabored, CTA without wheezing or rales. Heart: RRR no s3, s4, or murmurs. Abdomen: Soft, non-tender, non-distended, BS + x 4.  Extremities: No clubbing, cyanosis or edema. DP/PT/Radials 2+ and equal bilaterally.  Labs  No results for input(s): CKTOTAL, CKMB, TROPONINI in the last 72 hours. Lab Results  Component Value Date   WBC 6.3 08/07/2015   HGB 15.8 08/07/2015   HCT 45.3 08/07/2015   MCV 86.8 08/07/2015   PLT 172 08/07/2015     Recent Labs Lab 08/07/15 1058  NA 138  K 4.1  CL 105  CO2 22  BUN 9  CREATININE 0.76  CALCIUM 9.4  PROT 6.4*  BILITOT 0.8  ALKPHOS 49  ALT 50  AST 40  GLUCOSE 101*   Radiology/Studies Dg Chest 2 View: 08/07/2015  CLINICAL DATA:  64 year old male with a history of mid chest pain EXAM: CHEST - 2 VIEW COMPARISON:  10/04/2012 FINDINGS: Cardiomediastinal silhouette unchanged in size and contour. Surgical changes of prior median sternotomy and CABG. No evidence of pulmonary vascular congestion. No confluent airspace disease.  No pneumothorax or pleural effusion. No displaced fracture. Unremarkable appearance of the upper abdomen. IMPRESSION: No radiographic evidence of acute cardiopulmonary disease. Prior CABG. Signed, Yvone Neu. Loreta Ave, DO [facility] Electronically Signed   By: Gilmer Mor D.O.   On: 08/07/2015 12:21   ECG: Sinus bradycardia with rate in 50's. No acute ST or T-wave changes.   ECHOCARDIOGRAM: None on File   ASSESSMENT AND PLAN  1. Unstable Angina - history of CAD (s/p CABG 1995 w/ LIMA-LAD, SVG-OM (known to be occluded), SVG-RCA, with redo SVG-RCA in 2012 - initial troponin negative. EKG without acute changes. Will cycle cardiac enzymes. - start Heparin. Continue ASA, statin, BB, and ACE-I.  - plan for LHC in the AM by Dr. Tresa Endo. NPO after midnight.  2. HTN - continue Metoprolol and  Ramipril.  3. HLD - continue statin and Zetia.  Signed, Ellsworth Lennox, PA-C 08/07/2015, 2:20 PM Pager: [phone]  The patient was seen, examined and discussed with Nada Boozer, NP and I agree with the above.   64 year old male with known h/o CAD, CABG in  82 (at age 25) and redo single vessel BP to RCA in 2012. He presents with a chest pain that is typical for him but significantly less intense than in the past. Its retrosternal burning like with nausea, SOB and feeling of "dooming". He also felt retrosternal exertional chest pain yesterday. The initial troponin is negative x 1 and his ECG is unchanged. We will start heparin drip for UA, continue ASA, metoprolol, atorvastatin, zetia, ramipril. Based on troponin trend/ECG changes/symptoms overnight we will decide for cath vs a stress test.   Lars Masson 08/07/2015

## 2015-08-07 NOTE — ED Provider Notes (Signed)
CSN: [id number]     Arrival date & time 08/07/15  1026 History   First MD Initiated Contact with Patient 08/07/15 1046     Chief Complaint  Patient presents with  . Chest Pain    HPI   Seth Sanchez is a 64 y.o. male with a PMH of HLD, CAD, MI s/p CABG 1995 and 2012 who presents to the ED with chest pain and shortness of breath, which started this morning while he was driving to work. He states he has felt generally unwell for the past week with cold symptoms. He reports this morning, he began to experience chest pressure, which he states is now resolved. He denies exacerbating or alleviating factors. He took his home dose 325 ASA. He denies fever, chills, headache. He reports associated dizziness. He denies abdominal pain, nausea, vomiting, diarrhea, constipation, numbness, weakness, paresthesia.  Cardiologist: Dr. [name]   Past Medical History  Diagnosis Date  . MI (myocardial infarction) (HCC)     diaphramatic- Dr. Daphene Jaegerom Kelly  . Depression   . Vitamin D deficiency   . Diaphragmatic disorder     MI  . CAD (coronary artery disease)     a. LIMA-LAD, SVG-OM (known to be occluded), SVG-RCA in 1995 b. redo SVG-RCA in 2012  . Dyslipidemia   . Depression 1995    post CABG   Past Surgical History  Procedure Laterality Date  . Cardiac catheterization      x5  . Coronary artery bypass graft  1995    R coronary artery stent  . Coronary artery bypass graft  2013   Family History  Problem Relation Age of Onset  . Diabetes Mother   . Lung cancer Father     ex smoker  . Heart disease Neg Hx   . Stroke Neg Hx    Social History  Substance Use Topics  . Smoking status: Former Smoker    Types: Cigarettes    Quit date: 07/30/2007  . Smokeless tobacco: Former NeurosurgeonUser    Quit date: 09/23/2006     Comment: smoked 1980-1995; smoked 2005-2008. Maximum of 1.5 ppd  . Alcohol Use: No      Review of Systems  Constitutional: Negative for fever, chills and diaphoresis.  HENT: Positive for  congestion.   Respiratory: Positive for shortness of breath.   Cardiovascular: Positive for chest pain. Negative for leg swelling.  Gastrointestinal: Negative for nausea, vomiting, abdominal pain, diarrhea and constipation.  Neurological: Positive for dizziness. Negative for weakness, light-headedness, numbness and headaches.  All other systems reviewed and are negative.     Allergies  Review of patient's allergies indicates no known allergies.  Home Medications   Prior to Admission medications   Medication Sig Start Date End Date Taking? Authorizing Provider  amoxicillin-clavulanate (AUGMENTIN) 875-125 MG tablet Take 1 tablet by mouth 2 (two) times daily. Started on 08-01-15 for 10 days 08/01/15  Yes Historical Provider, MD  aspirin 81 MG tablet Take 81 mg by mouth daily.   Yes Historical Provider, MD  atorvastatin (LIPITOR) 40 MG tablet Take 1 tablet (40 mg total) by mouth daily. 07/25/15  Yes Lennette Biharihomas A Kelly, MD  Cholecalciferol (VITAMIN D-3) 5000 UNITS TABS Take 1 capsule by mouth daily.    Yes Historical Provider, MD  Coenzyme Q10 (COQ10) 100 MG CAPS Take 1 capsule by mouth daily.    Yes Historical Provider, MD  finasteride (PROPECIA) 1 MG tablet Take 1 mg by mouth daily.     Yes Historical Provider, MD  glucose blood (ONE TOUCH ULTRA TEST) test strip 1 each by Other route daily. Use as instructed    Yes Historical Provider, MD  metoprolol (LOPRESSOR) 50 MG tablet TAKE 0.5 TABLETS (25 MG TOTAL) BY MOUTH 2 (TWO) TIMES DAILY. 05/08/15  Yes Lennette Bihari, MD  Multiple Vitamins-Minerals (EYE VITAMINS PO) Take 1 tablet by mouth daily.   Yes Historical Provider, MD  niacin (NIASPAN) 1000 MG CR tablet Take 1,000 mg by mouth at bedtime.     Yes Historical Provider, MD  ONE TOUCH LANCETS MISC by Does not apply route daily.     Yes Historical Provider, MD  ramipril (ALTACE) 5 MG capsule TAKE 1 CAPSULE (5 MG TOTAL) BY MOUTH DAILY. 07/18/15  Yes Lennette Bihari, MD  ZETIA 10 MG tablet Take 10 mg by  mouth daily. 04/15/15  Yes Historical Provider, MD  nitroGLYCERIN (NITROSTAT) 0.4 MG SL tablet Place 1 tablet (0.4 mg total) under the tongue every 5 (five) minutes as needed for chest pain. 08/26/14   Lennette Bihari, MD    BP 128/69 mmHg  Pulse 64  Temp(Src) 98.6 F (37 C) (Oral)  Resp 11  Ht  (1.651 m)  Wt 176 lb 5.9 oz (80 kg)  BMI 29.35 kg/m2  SpO2 100% Physical Exam  Constitutional: He is oriented to person, place, and time. He appears well-developed and well-nourished. No distress.  HENT:  Head: Normocephalic and atraumatic.  Right Ear: External ear normal.  Left Ear: External ear normal.  Nose: Nose normal.  Mouth/Throat: Uvula is midline, oropharynx is clear and moist and mucous membranes are normal.  Eyes: Conjunctivae, EOM and lids are normal. Pupils are equal, round, and reactive to light. Right eye exhibits no discharge. Left eye exhibits no discharge. No scleral icterus.  Neck: Normal range of motion. Neck supple.  Cardiovascular: Normal rate, regular rhythm, normal heart sounds, intact distal pulses and normal pulses.   Pulmonary/Chest: Effort normal and breath sounds normal. No respiratory distress. He has no wheezes. He has no rales.  Abdominal: Soft. Normal appearance and bowel sounds are normal. He exhibits no distension and no mass. There is no tenderness. There is no rigidity, no rebound and no guarding.  Musculoskeletal: Normal range of motion. He exhibits no edema or tenderness.  Neurological: He is alert and oriented to person, place, and time. He has normal strength. No sensory deficit.  Skin: Skin is warm, dry and intact. No rash noted. He is not diaphoretic. No erythema. No pallor.  Psychiatric: He has a normal mood and affect. His speech is normal and behavior is normal.  Nursing note and vitals reviewed.   ED Course  Procedures (including critical care time)  Labs Review Labs Reviewed  COMPREHENSIVE METABOLIC PANEL - Abnormal; Notable for the  following:    Glucose, Bld 101 (*)    Total Protein 6.4 (*)    All other components within normal limits  CBG MONITORING, ED - Abnormal; Notable for the following:    Glucose-Capillary 152 (*)    All other components within normal limits  CBC WITH DIFFERENTIAL/PLATELET  TROPONIN I  TROPONIN I  TROPONIN I  HEPARIN LEVEL (UNFRACTIONATED)  Rosezena Sensor, ED    Imaging Review Dg Chest 2 View  08/07/2015  CLINICAL DATA:  64 year old male with a history of mid chest pain EXAM: CHEST - 2 VIEW COMPARISON:  10/04/2012 FINDINGS: Cardiomediastinal silhouette unchanged in size and contour. Surgical changes of prior median sternotomy and CABG. No evidence of pulmonary vascular  congestion. No confluent airspace disease.  No pneumothorax or pleural effusion. No displaced fracture. Unremarkable appearance of the upper abdomen. IMPRESSION: No radiographic evidence of acute cardiopulmonary disease. Prior CABG. Signed, Yvone Neu. Loreta Ave, DO [facility] Radiology Electronically Signed   By: Gilmer Mor D.O.   On: 08/07/2015 12:21     I have personally reviewed and evaluated these images and lab results as part of my medical decision-making.   EKG Interpretation   Date/Time:  Monday August 07 2015 10:36:49 EST Ventricular Rate:  57 PR Interval:  168 QRS Duration: 85 QT Interval:  406 QTC Calculation: 395 R Axis:   11 Text Interpretation:  Sinus rhythm Baseline wander in lead(s) III  Confirmed by Lincoln Brigham [phone]) on 08/07/2015 10:44:42 AM      MDM   Final diagnoses:  Chest pain, unspecified chest pain type    63 year old male with significant cardiac history presents with chest pain and shortness of breath, which started this morning. Reports his symptoms are now resolved. Denies fever, chills, headache. Reports associated dizziness. Denies abdominal pain, nausea, vomiting, diarrhea, constipation, numbness, weakness, paresthesia. Per  cardiology note in August 2016, patient undwent CABG in 1995 and had an inferior wall MI in August 2010 with successful RCA intervention; due to recurrent restenosis required repeat surgery in February 2012.Last nuclear perfusion study in 2014 was normal.  Patient is afebrile. Vital signs stable. Heart regular rate and rhythm. Lungs clear to auscultation bilaterally. Abdomen soft, nontender, nondistended. No lower extremity edema.  EKG sinus rhythm, heart rate 57. Troponin negative. CBC negative for leukocytosis or anemia. CMP unremarkable. Chest x-ray negative for acute cardiopulmonary disease.  Cardiology consulted given significant cardiac history. Patient to be admitted to cardiology for further evaluation and management.  BP 128/69 mmHg  Pulse 64  Temp(Src) 98.6 F (37 C) (Oral)  Resp 11  Ht  (1.651 m)  Wt 176 lb 5.9 oz (80 kg)  BMI 29.35 kg/m2  SpO2 100%       Mady Gemma, PA-C 08/07/15 1502  Tilden Fossa, MD 08/11/15 [phone]

## 2015-08-07 NOTE — ED Notes (Signed)
PA at the bedside.

## 2015-08-07 NOTE — ED Notes (Signed)
Met the patient and completed report at the bedside.

## 2015-08-07 NOTE — Progress Notes (Signed)
ANTICOAGULATION CONSULT NOTE - Initial Consult  Pharmacy Consult for heparin Indication: CP/ACS  No Known Allergies  Patient Measurements:   Heparin Dosing Weight: 77.8 kg  Vital Signs: Temp: 98.6 F (37 C) (11/14 1038) Temp Source: Oral (11/14 1038) BP: 128/69 mmHg (11/14 1415) Pulse Rate: 64 (11/14 1415)  Labs:  Recent Labs  08/07/15 1058  HGB 15.8  HCT 45.3  PLT 172  CREATININE 0.76    CrCl cannot be calculated (Unknown ideal weight.).   Medical History: Past Medical History  Diagnosis Date  . MI (myocardial infarction) (HCC)     diaphramatic- Dr. Daphene Jaegerom Kelly  . Depression   . Vitamin D deficiency   . Diaphragmatic disorder     MI  . CAD (coronary artery disease)     a. LIMA-LAD, SVG-OM (known to be occluded), SVG-RCA in 1995 b. redo SVG-RCA in 2012  . Dyslipidemia   . Depression 1995    post CABG    Assessment: [age] yom with CAD hx s/p multiple interventions presenting with CP. Pharmacy consulted to dose heparin for CP/ACS/UA (no anticoag pta). CBC wnl. No bleed documented.  Goal of Therapy:  Heparin level 0.3-0.7 units/ml Monitor platelets by anticoagulation protocol: Yes   Plan:  Heparin bolus 4000 units Heparin @ 950 units/h 6h HL Daily HL/CBC Mon s/sx bleeding  Babs BertinHaley Shaya Reddick, PharmD Clinical Pharmacist Pager [phone] 08/07/2015 2:30 PM

## 2015-08-07 NOTE — ED Notes (Signed)
Pt has extensive cardiac hx. Reports not feeling well x 1 week, had dizziness but onset of mid chest pressure this am.

## 2015-08-07 NOTE — ED Notes (Signed)
Pt given diet coke. 

## 2015-08-07 NOTE — Telephone Encounter (Signed)
Received a call from patient's wife.She stated husband having chest pain.Rates pain # 6.Advised to go to ER.Trish called.

## 2015-08-08 ENCOUNTER — Encounter (HOSPITAL_COMMUNITY): Payer: Self-pay | Admitting: Cardiovascular Disease

## 2015-08-08 ENCOUNTER — Encounter (HOSPITAL_COMMUNITY)
Admission: EM | Disposition: A | Discharge status: Home / Self Care | Payer: Self-pay | Source: Home / Self Care | Attending: Cardiology

## 2015-08-08 DIAGNOSIS — I25700 Atherosclerosis of coronary artery bypass graft(s), unspecified, with unstable angina pectoris: Secondary | ICD-10-CM

## 2015-08-08 HISTORY — PX: CARDIAC CATHETERIZATION: SHX172

## 2015-08-08 LAB — CBC
HEMATOCRIT: 41.5 % (ref 39.0–52.0)
Hemoglobin: 14.2 g/dL (ref 13.0–17.0)
MCH: 29.6 pg (ref 26.0–34.0)
MCHC: 34.2 g/dL (ref 30.0–36.0)
MCV: 86.6 fL (ref 78.0–100.0)
Platelets: 172 10*3/uL (ref 150–400)
RBC: 4.79 MIL/uL (ref 4.22–5.81)
RDW: 12.3 % (ref 11.5–15.5)
WBC: 7.4 10*3/uL (ref 4.0–10.5)

## 2015-08-08 LAB — BASIC METABOLIC PANEL
Anion gap: 9 (ref 5–15)
BUN: 14 mg/dL (ref 6–20)
CHLORIDE: 104 mmol/L (ref 101–111)
CO2: 25 mmol/L (ref 22–32)
CREATININE: 0.66 mg/dL (ref 0.61–1.24)
Calcium: 8.8 mg/dL — ABNORMAL LOW (ref 8.9–10.3)
GFR calc Af Amer: >60 mL/min (ref >60)
GFR calc non Af Amer: >60 mL/min (ref >60)
Glucose, Bld: 134 mg/dL — ABNORMAL HIGH (ref 65–99)
Potassium: 4.1 mmol/L (ref 3.5–5.1)
Sodium: 138 mmol/L (ref 135–145)

## 2015-08-08 LAB — HEPARIN LEVEL (UNFRACTIONATED): Heparin Unfractionated: 0.44 IU/mL (ref 0.30–0.70)

## 2015-08-08 LAB — TROPONIN I: Troponin I: <0.03 ng/mL (ref <0.031)

## 2015-08-08 LAB — PROTIME-INR
INR: 1.1 (ref 0.00–1.49)
Prothrombin Time: 14.3 seconds (ref 11.6–15.2)

## 2015-08-08 SURGERY — LEFT HEART CATH AND CORONARY ANGIOGRAPHY

## 2015-08-08 MED ORDER — ISOSORBIDE MONONITRATE ER 30 MG PO TB24
30.0000 mg | ORAL_TABLET | Freq: Every day | ORAL | Status: DC
  Administered 2015-08-08: 30 mg via ORAL
  Filled 2015-08-08: qty 1

## 2015-08-08 MED ORDER — RANOLAZINE ER 500 MG PO TB12
500.0000 mg | ORAL_TABLET | Freq: Two times a day (BID) | ORAL | Status: DC
  Administered 2015-08-08: 500 mg via ORAL
  Filled 2015-08-08: qty 1

## 2015-08-08 MED ORDER — ONDANSETRON HCL 4 MG/2ML IJ SOLN: 4.0000 mg | Freq: Four times a day (QID) | INTRAMUSCULAR | Status: DC | PRN

## 2015-08-08 MED ORDER — SODIUM CHLORIDE 0.9 % IJ SOLN: 3.0000 mL | Freq: Two times a day (BID) | INTRAMUSCULAR | Status: DC

## 2015-08-08 MED ORDER — SODIUM CHLORIDE 0.9 % IV SOLN
INTRAVENOUS | Status: DC
Start: 2015-08-08 — End: 2015-08-08

## 2015-08-08 MED ORDER — LIDOCAINE HCL (PF) 1 % IJ SOLN
INTRAMUSCULAR | Status: AC
  Filled 2015-08-08: qty 30

## 2015-08-08 MED ORDER — HEPARIN (PORCINE) IN NACL 2-0.9 UNIT/ML-% IJ SOLN
INTRAMUSCULAR | Status: AC
  Filled 2015-08-08: qty 500

## 2015-08-08 MED ORDER — MIDAZOLAM HCL 2 MG/2ML IJ SOLN
INTRAMUSCULAR | Status: AC
  Filled 2015-08-08: qty 4

## 2015-08-08 MED ORDER — FENTANYL CITRATE (PF) 100 MCG/2ML IJ SOLN
INTRAMUSCULAR | Status: AC
  Filled 2015-08-08: qty 4

## 2015-08-08 MED ORDER — RANOLAZINE ER 500 MG PO TB12: 500.0000 mg | ORAL_TABLET | Freq: Two times a day (BID) | ORAL | Status: DC

## 2015-08-08 MED ORDER — MIDAZOLAM HCL 2 MG/2ML IJ SOLN
INTRAMUSCULAR | Status: DC | PRN
  Administered 2015-08-08: 2 mg via INTRAVENOUS

## 2015-08-08 MED ORDER — SODIUM CHLORIDE 0.9 % IV SOLN: 250.0000 mL | INTRAVENOUS | Status: DC | PRN

## 2015-08-08 MED ORDER — ACETAMINOPHEN 325 MG PO TABS: 650.0000 mg | ORAL_TABLET | ORAL | Status: AC | PRN

## 2015-08-08 MED ORDER — ISOSORBIDE MONONITRATE ER 30 MG PO TB24: 30.0000 mg | ORAL_TABLET | Freq: Every day | ORAL | Status: DC

## 2015-08-08 MED ORDER — LIDOCAINE HCL (PF) 1 % IJ SOLN
INTRAMUSCULAR | Status: DC | PRN
  Administered 2015-08-08: 10:00:00

## 2015-08-08 MED ORDER — ACETAMINOPHEN 325 MG PO TABS: 650.0000 mg | ORAL_TABLET | ORAL | Status: DC | PRN

## 2015-08-08 MED ORDER — SODIUM CHLORIDE 0.9 % IJ SOLN: 3.0000 mL | INTRAMUSCULAR | Status: DC | PRN

## 2015-08-08 MED ORDER — IOHEXOL 350 MG/ML SOLN
INTRAVENOUS | Status: DC | PRN
  Administered 2015-08-08: 115 mL via INTRAVENOUS

## 2015-08-08 MED ORDER — FENTANYL CITRATE (PF) 100 MCG/2ML IJ SOLN
INTRAMUSCULAR | Status: DC | PRN
  Administered 2015-08-08: 50 ug via INTRAVENOUS

## 2015-08-08 MED ORDER — DIAZEPAM 5 MG PO TABS: 5.0000 mg | ORAL_TABLET | Freq: Four times a day (QID) | ORAL | Status: DC | PRN

## 2015-08-08 SURGICAL SUPPLY — 11 items
CATH EXPO 5F MPA-1 (CATHETERS) ×3 IMPLANT
CATH INFINITI 5 FR IM (CATHETERS) ×3 IMPLANT
CATH INFINITI 5 FR RCB (CATHETERS) ×3 IMPLANT
CATH INFINITI 5FR MULTPACK ANG (CATHETERS) ×3 IMPLANT
KIT HEART LEFT (KITS) ×3 IMPLANT
PACK CARDIAC CATHETERIZATION (CUSTOM PROCEDURE TRAY) ×3 IMPLANT
SHEATH PINNACLE 5F 10CM (SHEATH) ×6 IMPLANT
SYR MEDRAD MARK V 150ML (SYRINGE) ×3 IMPLANT
TRANSDUCER W/STOPCOCK (MISCELLANEOUS) ×3 IMPLANT
WIRE EMERALD 3MM-J .025X260CM (WIRE) ×3 IMPLANT
WIRE EMERALD 3MM-J .035X150CM (WIRE) ×3 IMPLANT

## 2015-08-08 NOTE — Care Management Note (Signed)
Case Management Note  Patient Details  Name: Seth DerryJohn Colen Sanchez MRN: [id number] Date of Birth: Aug 10, 1951  Subjective/Objective: Pt admitted for chest pain-plan for cardiac cath 08-08-15.                    Action/Plan: No needs identified by CM at this time. Will continue to monitor.    Expected Discharge Date:                  Expected Discharge Plan:  Home/Self Care  In-House Referral:     Discharge planning Services  CM Consult  Post Acute Care Choice:    Choice offered to:     DME Arranged:    DME Agency:     HH Arranged:    HH Agency:     Status of Service:  In process, will continue to follow  Medicare Important Message Given:    Date Medicare IM Given:    Medicare IM give by:    Date Additional Medicare IM Given:    Additional Medicare Important Message give by:     If discussed at Long Length of Stay Meetings, dates discussed:    Additional Comments:  Gala LewandowskyGraves-Bigelow, Jerrid Forgette Kaye, RN 08/08/2015, 11:14 AM

## 2015-08-08 NOTE — Progress Notes (Signed)
    Subjective: Pain free  Objective: Vital signs in last 24 hours: Temp:  [97.8 F (36.6 C)-98.6 F (37 C)] 97.9 F (36.6 C) (11/15 0534) Pulse Rate:  [51-64] 56 (11/15 0534) Resp:  [8-20] 14 (11/15 0534) BP: (119-177)/(57-78) 133/58 mmHg (11/15 0534) SpO2:  [95 %-100 %] 97 % (11/15 0534) Weight:  [171 lb 4.8 oz (77.7 kg)-176 lb 5.9 oz (80 kg)] 171 lb 9.6 oz (77.837 kg) (11/15 0534) Last BM Date: 08/07/15  Intake/Output from previous day: 11/14 0701 - 11/15 0700 In: 233.3 [I.V.:233.3] Out: 1750 [Urine:1750] Intake/Output this shift:    Medications Scheduled Meds: . amoxicillin-clavulanate  1 tablet Oral BID  . aspirin EC  81 mg Oral Daily  . atorvastatin  40 mg Oral q1800  . ezetimibe  10 mg Oral Daily  . finasteride  1 mg Oral Daily  . metoprolol  25 mg Oral BID  . ramipril  5 mg Oral Daily  . sodium chloride  3 mL Intravenous Q12H   Continuous Infusions: . sodium chloride 1 mL/kg/hr (08/08/15 0643)  . heparin 950 Units/hr (08/07/15 1514)   PRN Meds:.sodium chloride, acetaminophen, Influenza vac split quadrivalent PF, nitroGLYCERIN, ondansetron (ZOFRAN) IV, sodium chloride  PE: General appearance: alert, cooperative and no distress Lungs: clear to auscultation bilaterally Heart: regular rate and rhythm, S1, S2 normal, no murmur, click, rub or gallop Extremities: No LEE Pulses: 2+ and symmetric Skin: Warm and dry Neurologic: Grossly normal  Lab Results:   Recent Labs  08/07/15 1058 08/08/15 0215  WBC 6.3 7.4  HGB 15.8 14.2  HCT 45.3 41.5  PLT 172 172   BMET  Recent Labs  08/07/15 1058 08/08/15 0215  NA 138 138  K 4.1 4.1  CL 105 104  CO2 22 25  GLUCOSE 101* 134*  BUN 9 14  CREATININE 0.76 0.66  CALCIUM 9.4 8.8*   PT/INR  Recent Labs  08/08/15 0215  LABPROT 14.3  INR 1.10    Assessment/Plan  Principal Problem:   Unstable angina (HCC) Active Problems:   Dyslipidemia   Diabetes mellitus- diet controlled  Ruled out for MI.  IV  heparin, ASA, statin, zetia, lopressor 25 bid, ramipril 5.  BP controlled.  He is on for cath this morning but would like to discuss it with Dr. Lateasha Sanchez before hand.  The pain, although not as intense, is reminiscent of prior times when problems were found during a cath.  He and his wife are in the process of moving to [place].     LOS: 1 day    [name], [name] PA-C 08/08/2015 8:17 AM  Patient seen and examined. Agree with assessment and plan.  Seth Sanchez is well-known to me.  He is status post initial CABG revascularization surgery in 1995 with LIMA to his LAD and vein graft was OM 2.  He suffered an inferior MI in 2010 and subsequently required multiple procedures after his RCA was open due to in-stent restenosis including brachytherapy.  Healed.  He underwent redo CABG surgery with a SVG to RCA in 2012.  He has been doing exceptionally well for the past several days has noticed recurrent symptomatology suggestive of prior discomfort.  I discussed with him the symptoms at length.  I agree with plans for definitive cardiac catheterization.  The risks benefits of the procedure were discussed with the patient who wishes to proceed with the catheterization study today.   Seth Nevills A. Koren Plyler, MD, FACC 08/08/2015 9:35 AM  

## 2015-08-08 NOTE — Discharge Instructions (Addendum)
PLEASE REMEMBER TO BRING ALL OF YOUR MEDICATIONS TO EACH OF YOUR FOLLOW-UP OFFICE VISITS.  PLEASE ATTEND ALL SCHEDULED FOLLOW-UP APPOINTMENTS.   Activity: Increase activity slowly as tolerated. You may shower, but no soaking baths (or swimming) for 1 week. No driving for 24 hours. No lifting over 5 lbs for 1 week. No sexual activity for 1 week.   You May Return to Work: in 1 week (if applicable)  Wound Care: You may wash cath site gently with soap and water. Keep cath site clean and dry. If you notice pain, swelling, bleeding or pus at your cath site, please call [phone].  Radial Site Care Refer to this sheet in the next few weeks. These instructions provide you with information about caring for yourself after your procedure. Your health care provider may also give you more specific instructions. Your treatment has been planned according to current medical practices, but problems sometimes occur. Call your health care provider if you have any problems or questions after your procedure. WHAT TO EXPECT AFTER THE PROCEDURE After your procedure, it is typical to have the following:  Bruising at the radial site that usually fades within 1-2 weeks.  Blood collecting in the tissue (hematoma) that may be painful to the touch. It should usually decrease in size and tenderness within 1-2 weeks. HOME CARE INSTRUCTIONS  Take medicines only as directed by your health care provider.  You may shower 24-48 hours after the procedure or as directed by your health care provider. Remove the bandage (dressing) and gently wash the site with plain soap and water. Pat the area dry with a clean towel. Do not rub the site, because this may cause bleeding.  Do not take baths, swim, or use a hot tub until your health care provider approves.  Check your insertion site every day for redness, swelling, or drainage.  Do not apply powder or lotion to the site.  Do not flex or bend the affected arm for 24 hours or  as directed by your health care provider.  Do not push or pull heavy objects with the affected arm for 24 hours or as directed by your health care provider.  Do not lift over 10 lb (4.5 kg) for 5 days after your procedure or as directed by your health care provider.  Ask your health care provider when it is okay to:  Return to work or school.  Resume usual physical activities or sports.  Resume sexual activity.  Do not drive home if you are discharged the same day as the procedure. Have someone else drive you.  You may drive 24 hours after the procedure unless otherwise instructed by your health care provider.  Do not operate machinery or power tools for 24 hours after the procedure.  If your procedure was done as an outpatient procedure, which means that you went home the same day as your procedure, a responsible adult should be with you for the first 24 hours after you arrive home.  Keep all follow-up visits as directed by your health care provider. This is important. SEEK MEDICAL CARE IF:  You have a fever.  You have chills.  You have increased bleeding from the radial site. Hold pressure on the site. SEEK IMMEDIATE MEDICAL CARE IF:  You have unusual pain at the radial site.  You have redness, warmth, or swelling at the radial site.  You have drainage (other than a small amount of blood on the dressing) from the radial site.  The radial  machinery or power tools for 24 hours after the procedure. °· If your procedure was done as an outpatient procedure, which means that you went home the same day as your procedure, a responsible adult should be with you for the first 24 hours after you arrive home. °· Keep all follow-up visits as directed by your health care provider. This is important. °SEEK MEDICAL CARE IF: °· You have a fever. °· You have chills. °· You have increased bleeding from the radial site. Hold pressure on the site. °SEEK IMMEDIATE MEDICAL CARE IF: °· You have unusual pain at the radial site. °· You have redness, warmth, or swelling at the radial site. °· You have drainage (other than a small amount of blood on the dressing) from the radial site. °· The radial site is bleeding, and the bleeding does not stop after 30 minutes of holding steady pressure on the site. °· Your arm or hand becomes pale, cool, tingly, or numb. °  °This information is not intended to replace advice given to you by your health care provider. Make sure you discuss any questions you have with your health care provider. °  °Document Released: 10/12/2010 Document Revised: 09/30/2014 Document  Reviewed: 03/28/2014 °Elsevier Interactive Patient Education ©2016 [employer] ° °

## 2015-08-08 NOTE — Telephone Encounter (Signed)
[place]

## 2015-08-08 NOTE — Discharge Summary (Signed)
Patient ID: Seth Sanchez,  MRN: [id number], DOB/AGE: [birth year] 64 y.o.  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 Multiple RCA PCI's- Re Do CABG x 1 2012. Low risk Myoview Jan 2014   Essential hypertension   Dyslipidemia   Diabetes mellitus- diet controlled    Procedures: Coronary angiogram 08/07/15   Hospital Course:  64 y.o. male followed by Dr [name] with past medical history of CAD (s/p CABG 1995 LIMA-LAD, SVG-OM (known to be occluded), and an SVG-RCA. He had redo SVG-RCA in 2012. Other problems include HTN, and HLD. The pt  [facility] for evaluation of chest pain. Troponin's were negative. He underwent coronary angiogram 08/07/15 by Dr [name]. This revealed new occlusion of the SVG-RCA.  He had a widely patent LIMA-LAD with collaterals to the RCA. Dr [name] recommended medical treatment. Imdur and Ranexa have been added. The pt has ambulated without chest pain and is anxious to go home tonight. [place]. I'll arrange for a f/u in a week or se with an APP and an OV with Dr [name] [place]   Discharge Vitals:  Blood pressure 110/53, pulse 63, temperature 97.8 F (36.6 C), temperature source Oral, resp. rate 17, height 5\' 5"  (1.651 m), weight 171 lb 9.6 oz (77.837 kg), SpO2 96 %.    Labs: Results for orders placed or performed during the hospital encounter of 08/07/15 (from the past 24 hour(s))  Troponin I     Status: None   Collection Time: 08/07/15  8:33 PM  Result Value Ref Range   Troponin I <0.03 <0.031 ng/mL  Heparin level (unfractionated)     Status: None   Collection Time: 08/07/15  8:33 PM  Result Value Ref Range   Heparin  Unfractionated 0.56 0.30 - 0.70 IU/mL  Troponin I     Status: None   Collection Time: 08/08/15  2:15 AM  Result Value Ref Range   Troponin I <0.03 <0.031 ng/mL  Basic metabolic panel     Status: Abnormal   Collection Time: 08/08/15  2:15 AM  Result Value Ref Range   Sodium 138 135 - 145 mmol/L   Potassium 4.1 3.5 - 5.1 mmol/L   Chloride 104 101 - 111 mmol/L   CO2 25 22 - 32 mmol/L   Glucose, Bld 134 (H) 65 - 99 mg/dL   BUN 14 6 - 20 mg/dL   Creatinine, Ser 7.820.66 0.61 - 1.24 mg/dL   Calcium 8.8 (L) 8.9 - 10.3 mg/dL   GFR calc non Af Amer >60 >60 mL/min   GFR calc Af Amer >60 >60 mL/min   Anion gap 9 5 - 15  Heparin level (unfractionated)     Status: None   Collection Time: 08/08/15  2:15 AM  Result Value Ref Range   Heparin Unfractionated 0.44 0.30 - 0.70 IU/mL  CBC     Status: None   Collection Time: 08/08/15  2:15 AM  Result Value Ref Range   WBC 7.4 4.0 - 10.5 K/uL   RBC 4.79 4.22 - 5.81 MIL/uL   Hemoglobin 14.2 13.0 - 17.0 g/dL   HCT 95.641.5 21.339.0 - 08.652.0 %   MCV 86.6 78.0 - 100.0 fL  MCH 29.6 26.0 - 34.0 pg   MCHC 34.2 30.0 - 36.0 g/dL   RDW 16.1 09.6 - 04.5 %   Platelets 172 150 - 400 K/uL  Protime-INR     Status: None   Collection Time: 08/08/15  2:15 AM  Result Value Ref Range   Prothrombin Time 14.3 11.6 - 15.2 seconds   INR 1.10 0.00 - 1.49    Disposition:  Follow-up Information    Follow up with Lennette Bihari, MD.   Specialty:  Cardiology   Why:  office will contact you   Contact information:   [address] [place] [address] [phone]       Discharge Medications:    Medication List    TAKE these medications        acetaminophen 325 MG tablet  Commonly known as:  TYLENOL  Take 2 tablets (650 mg total) by mouth every 4 (four) hours as needed for headache or mild pain.     amoxicillin-clavulanate 875-125 MG tablet  Commonly known as:  AUGMENTIN  Take 1 tablet by mouth 2 (two) times daily. Started on 08-01-15 for 10 days      aspirin 81 MG tablet  Take 81 mg by mouth daily.     atorvastatin 40 MG tablet  Commonly known as:  LIPITOR  Take 1 tablet (40 mg total) by mouth daily.     CoQ10 100 MG Caps  Take 1 capsule by mouth daily.     EYE VITAMINS PO  Take 1 tablet by mouth daily.     finasteride 1 MG tablet  Commonly known as:  PROPECIA  Take 1 mg by mouth daily.     isosorbide mononitrate 30 MG 24 hr tablet  Commonly known as:  IMDUR  Take 1 tablet (30 mg total) by mouth daily.     metoprolol 50 MG tablet  Commonly known as:  LOPRESSOR  TAKE 0.5 TABLETS (25 MG TOTAL) BY MOUTH 2 (TWO) TIMES DAILY.     niacin 1000 MG CR tablet  Commonly known as:  NIASPAN  Take 1,000 mg by mouth at bedtime.     nitroGLYCERIN 0.4 MG SL tablet  Commonly known as:  NITROSTAT  Place 1 tablet (0.4 mg total) under the tongue every 5 (five) minutes as needed for chest pain.     ONE TOUCH LANCETS Misc  by Does not apply route daily.     ONE TOUCH ULTRA TEST test strip  Generic drug:  glucose blood  1 each by Other route daily. Use as instructed     ramipril 5 MG capsule  Commonly known as:  ALTACE  TAKE 1 CAPSULE (5 MG TOTAL) BY MOUTH DAILY.     ranolazine 500 MG 12 hr tablet  Commonly known as:  RANEXA  Take 1 tablet (500 mg total) by mouth 2 (two) times daily.     Vitamin D-3 5000 UNITS Tabs  Take 1 capsule by mouth daily.     ZETIA 10 MG tablet  Generic drug:  ezetimibe  Take 10 mg by mouth daily.         Duration of Discharge Encounter: Greater than 30 minutes including physician time.  Jolene Provost PA-C 08/08/2015 4:31 PM

## 2015-08-08 NOTE — H&P (View-Only) (Signed)
    Subjective: Pain free  Objective: Vital signs in last 24 hours: Temp:  [97.8 F (36.6 C)-98.6 F (37 C)] 97.9 F (36.6 C) (11/15 0534) Pulse Rate:  [51-64] 56 (11/15 0534) Resp:  [8-20] 14 (11/15 0534) BP: (119-177)/(57-78) 133/58 mmHg (11/15 0534) SpO2:  [95 %-100 %] 97 % (11/15 0534) Weight:  [171 lb 4.8 oz (77.7 kg)-176 lb 5.9 oz (80 kg)] 171 lb 9.6 oz (77.837 kg) (11/15 0534) Last BM Date: 08/07/15  Intake/Output from previous day: 11/14 0701 - 11/15 0700 In: 233.3 [I.V.:233.3] Out: 1750 [Urine:1750] Intake/Output this shift:    Medications Scheduled Meds: . amoxicillin-clavulanate  1 tablet Oral BID  . aspirin EC  81 mg Oral Daily  . atorvastatin  40 mg Oral q1800  . ezetimibe  10 mg Oral Daily  . finasteride  1 mg Oral Daily  . metoprolol  25 mg Oral BID  . ramipril  5 mg Oral Daily  . sodium chloride  3 mL Intravenous Q12H   Continuous Infusions: . sodium chloride 1 mL/kg/hr (08/08/15 16100643)  . heparin 950 Units/hr (08/07/15 1514)   PRN Meds:.sodium chloride, acetaminophen, Influenza vac split quadrivalent PF, nitroGLYCERIN, ondansetron (ZOFRAN) IV, sodium chloride  PE: General appearance: alert, cooperative and no distress Lungs: clear to auscultation bilaterally Heart: regular rate and rhythm, S1, S2 normal, no murmur, click, rub or gallop Extremities: No LEE Pulses: 2+ and symmetric Skin: Warm and dry Neurologic: Grossly normal  Lab Results:   Recent Labs  08/07/15 1058 08/08/15 0215  WBC 6.3 7.4  HGB 15.8 14.2  HCT 45.3 41.5  PLT 172 172   BMET  Recent Labs  08/07/15 1058 08/08/15 0215  NA 138 138  K 4.1 4.1  CL 105 104  CO2 22 25  GLUCOSE 101* 134*  BUN 9 14  CREATININE 0.76 0.66  CALCIUM 9.4 8.8*   PT/INR  Recent Labs  08/08/15 0215  LABPROT 14.3  INR 1.10    Assessment/Plan  Principal Problem:   Unstable angina (HCC) Active Problems:   Dyslipidemia   Diabetes mellitus- diet controlled  Ruled out for MI.  IV  heparin, ASA, statin, zetia, lopressor 25 bid, ramipril 5.  BP controlled.  He is on for cath this morning but would like to discuss it with Dr. [name] before hand.  The pain, although not as intense, is reminiscent of prior times when problems were found during a cath.  [place].     LOS: 1 day    [name], [name] PA-C 08/08/2015 8:17 AM  Patient seen and examined. Agree with assessment and plan.  [name] is well-known to me.  He is status post initial CABG revascularization surgery in 1995 with LIMA to his LAD and vein graft was OM 2.  He suffered an inferior MI in 2010 and subsequently required multiple procedures after his RCA was open due to in-stent restenosis including brachytherapy.  Healed.  He underwent redo CABG surgery with a SVG to RCA in 2012.  He has been doing exceptionally well for the past several days has noticed recurrent symptomatology suggestive of prior discomfort.  I discussed with him the symptoms at length.  I agree with plans for definitive cardiac catheterization.  The risks benefits of the procedure were discussed with the patient who wishes to proceed with the catheterization study today.   Lennette Biharihomas A. Kelly, MD, [facility] 08/08/2015 9:35 AM

## 2015-08-08 NOTE — Progress Notes (Signed)
ANTICOAGULATION CONSULT NOTE - Follow Up Consult  Pharmacy Consult for heparin Indication: CP/ACS  No Known Allergies  Patient Measurements: Height: 5\' 5"  (165.1 cm) Weight: 171 lb 9.6 oz (77.837 kg) IBW/kg (Calculated) : 61.5 Heparin Dosing Weight: 77.8 kg  Vital Signs: Temp: 97.9 F (36.6 C) (11/15 0534) Temp Source: Oral (11/15 0534) BP: 133/58 mmHg (11/15 0534) Pulse Rate: 56 (11/15 0534)  Labs:  Recent Labs  08/07/15 1058 08/07/15 1508 08/07/15 2033 08/08/15 0215  HGB 15.8  --   --  14.2  HCT 45.3  --   --  41.5  PLT 172  --   --  172  LABPROT  --   --   --  14.3  INR  --   --   --  1.10  HEPARINUNFRC  --   --  0.56 0.44  CREATININE 0.76  --   --  0.66  TROPONINI  --  <0.03 <0.03 <0.03    Estimated Creatinine Clearance: 90.9 mL/min (by C-G formula based on Cr of 0.66).  Assessment: [age] yom with CAD hx s/p multiple interventions presenting with CP. Pharmacy consulted to dose heparin for CP/ACS/UA (no anticoag pta).  Heparin drip currently at 950 units/hr with 2 therapeutic heparin levels. CBC is WNL, no bleeding noted. Cath planned for today.    Goal of Therapy:  Heparin level 0.3-0.7 units/ml Monitor platelets by anticoagulation protocol: Yes   Plan:  -Continue Heparin @ 950 units/h -f/u after cath -Daily HL/CBC -Mon s/sx bleeding  Cleda Imel D. Altamese Deguire, PharmD, BCPS Clinical Pharmacist Pager: [phone] 08/08/2015 8:31 AM

## 2015-08-08 NOTE — Progress Notes (Signed)
UR Completed Nyla Creason Graves-Bigelow, RN,BSN [phone]  

## 2015-08-08 NOTE — Interval H&P Note (Signed)
Cath Lab Visit (complete for each Cath Lab visit)  Clinical Evaluation Leading to the Procedure:   ACS: No.  Non-ACS:    Anginal Classification: CCS III  Anti-ischemic medical therapy: Maximal Therapy (2 or more classes of medications)  Non-Invasive Test Results: No non-invasive testing performed  Prior CABG: Previous CABG      History and Physical Interval Note:  08/08/2015 9:37 AM  Seth DerryJohn Colen Sanchez  has presented today for surgery, with the diagnosis of unstable angina  The various methods of treatment have been discussed with the patient and family. After consideration of risks, benefits and other options for treatment, the patient has consented to  Procedure(s): Left Heart Cath and Coronary Angiography (N/A) as a surgical intervention .  The patient's history has been reviewed, patient examined, no change in status, stable for surgery.  I have reviewed the patient's chart and labs.  Questions were answered to the patient's satisfaction.     KELLY,THOMAS A

## 2015-08-08 NOTE — Progress Notes (Signed)
Site area: right groin fa sheath Site Prior to Removal:  Level 0 Pressure Applied For:  25 minutes Manual:   yes Patient Status During Pull:  stable Post Pull Site:  Level  0 Post Pull Instructions Given:  stable Post Pull Pulses Present: yes Dressing Applied:  tegaderm Bedrest begins @ 1110   Comments:

## 2015-08-09 ENCOUNTER — Telehealth: Payer: Self-pay

## 2015-08-09 LAB — POCT ACTIVATED CLOTTING TIME: Activated Clotting Time: 91 seconds

## 2015-08-09 NOTE — Telephone Encounter (Signed)
Prior auth for Ranexa ER 500mg  [facility] [place]

## 2015-08-11 ENCOUNTER — Telehealth: Payer: Self-pay

## 2015-08-11 NOTE — Telephone Encounter (Signed)
Ranexa approved per [employer], good through 2039. Reference #[id number].

## 2015-08-14 ENCOUNTER — Ambulatory Visit (INDEPENDENT_AMBULATORY_CARE_PROVIDER_SITE_OTHER): Payer: BLUE CROSS/BLUE SHIELD | Admitting: Cardiovascular Disease

## 2015-08-14 ENCOUNTER — Encounter: Payer: Self-pay | Admitting: Cardiovascular Disease

## 2015-08-14 VITALS — BP 130/70 | HR 66 | Ht 65.0 in | Wt 178.2 lb

## 2015-08-14 DIAGNOSIS — E785 Hyperlipidemia, unspecified: Secondary | ICD-10-CM | POA: Diagnosis not present

## 2015-08-14 DIAGNOSIS — I10 Essential (primary) hypertension: Secondary | ICD-10-CM | POA: Diagnosis not present

## 2015-08-14 DIAGNOSIS — I25810 Atherosclerosis of coronary artery bypass graft(s) without angina pectoris: Secondary | ICD-10-CM

## 2015-08-14 NOTE — Patient Instructions (Signed)
Your physician recommends that you schedule a follow-up appointment in: 2 months with Dr. Kelly.    

## 2015-08-14 NOTE — Progress Notes (Signed)
Patient ID: Zlatan Hornback, male   DOB: [birth year], 64 y.o.   MRN: [id number]     HPI: Smokey Melott is a 64 y.o. male presents to the office for a follow-up office visit following his cardiac catheterization last week.  Mr. Neo Yepiz has established CAD and underwent initial CABG surgery 1995 with LIMA to the LAD, and vein to the obtuse marginal vessel. In August 2010 he suffered an inferior wall MI and his RCA was occluded prior to a previously placed stent.  He had placement of 3 tandem stents with restoration of normal flow with demonstration of almost complete salvage of myocardium. He developed recurrent episodes of restenosis even despite brachytherapy and on 11/12/2010 underwent redo surgery by Dr. Cyndia Bent with a  graft to the distal RCA. Peri-peratively he did have insulin resistance and ultimately became overtly diabetic but subsequently with significant dietary adjustments this has essentially normalized.  A nuclear perfusion study in January 2014  continued to show normal perfusion and function. When I saw him in January 2014 an NMR lipoprofile  showed increased LDL particle numbers 1646 and his atorvastatin dose was increased from 20 to 40 mg. I also further try to titrate his Ramapril from 5 to10 mg for optimal blood pressure control.   He  has established primary care with Dr. Shelia Media since Dr. Linna Darner is retiring.  Laboratory in June 2016 was reviewed and at that time his total cholesterol was 181, HDL 49, LDL cholesterol 112, despite taking niacin 1000 mg, atorvastatin 40 mg, and Zetia 10 mg.  Over the last 4 months, he has become a vegetarian and has stopped eating meat.  Repeat blood work on 04/24/2015 now showed total cholesterol 137, triglycerides 63, HDL 52 and LDL 72.   When I last saw him 3 months ago, he was stable without chest pain, shortness of breath or palpitations.  Last week, he developed several day history of recurrent symptoms that were similar to his prior chest pain.   He underwent repeat cardiac catheterization on 08/08/2015 which showed  native coronary obstructive disease with old total occlusion of the LAD immediately after the takeoff of the first diagonal and septal perforating artery; normal-appearing left circumflex vessel; and old total occlusion of the native proximal RCA at the stented segments. There was evidence for faint collateralization to the distal RCA from the circumflex vessel, and collateralization to RV marginal branches from the LIMA to LAD injection.  The LIMA graft supplying the LAD was widely patent with evidence for collateralization to the stented segment of the RCA via marginal branches.  There was a new ostial occlusion of the vein graft from 2012 which had supplied the RCA and previously documented occlusion of the graft which had supplied and OM 2 vessel.  Increased medical therapy was recommended and isosorbide mononitrate 30 mg was added to his regimen.  Also, the patient was started on Ranexa 5 mg twice a day.  However, he has not yet received his Ranexa prescription.  The patient will be moving to [place] in several weeks to move furniture and then again back in January.  He states initially he will be coming back to the [place] area on a monthly basis.  He presents for evaluation.  Past Medical History  Diagnosis Date  . MI (myocardial infarction) ([facility] [place])     diaphramatic- Dr. Ellouise Newer  . Depression   . Vitamin D deficiency   . Diaphragmatic disorder     MI  . CAD (coronary  artery disease)     a. LIMA-LAD, SVG-OM (known to be occluded), SVG-RCA in 1995 b. redo SVG-RCA in 2012  . Dyslipidemia   . Depression 1995    post CABG    Past Surgical History  Procedure Laterality Date  . Cardiac catheterization      x5  . Coronary artery bypass graft  1995    R coronary artery stent  . Coronary artery bypass graft  2013  . Cardiac catheterization N/A 08/08/2015    Procedure: Left Heart Cath and Coronary Angiography;   Surgeon: Troy Sine, MD;  Location: [facility];  Service: Cardiovascular;  Laterality: N/A;    No Known Allergies  Current Outpatient Prescriptions  Medication Sig Dispense Refill  . acetaminophen (TYLENOL) 325 MG tablet Take 2 tablets (650 mg total) by mouth every 4 (four) hours as needed for headache or mild pain.    [name] aspirin 81 MG tablet Take 81 mg by mouth daily.    [name] atorvastatin (LIPITOR) 40 MG tablet Take 1 tablet (40 mg total) by mouth daily. 90 tablet 3  . Cholecalciferol (VITAMIN D-3) 5000 UNITS TABS Take 1 capsule by mouth daily.     . Coenzyme Q10 (COQ10) 100 MG CAPS Take 1 capsule by mouth daily.     . finasteride (PROPECIA) 1 MG tablet Take 1 mg by mouth daily.      [name] glucose blood (ONE TOUCH ULTRA TEST) test strip 1 each by Other route daily. Use as instructed     . isosorbide mononitrate (IMDUR) 30 MG 24 hr tablet Take 1 tablet (30 mg total) by mouth daily. 30 tablet 11  . metoprolol (LOPRESSOR) 50 MG tablet TAKE 0.5 TABLETS (25 MG TOTAL) BY MOUTH 2 (TWO) TIMES DAILY. 180 tablet 1  . Multiple Vitamins-Minerals (EYE VITAMINS PO) Take 1 tablet by mouth daily.    . niacin (NIASPAN) 1000 MG CR tablet Take 1,000 mg by mouth at bedtime.      . nitroGLYCERIN (NITROSTAT) 0.4 MG SL tablet Place 1 tablet (0.4 mg total) under the tongue every 5 (five) minutes as needed for chest pain. 25 tablet 3  . ONE TOUCH LANCETS MISC by Does not apply route daily.      . ramipril (ALTACE) 5 MG capsule TAKE 1 CAPSULE (5 MG TOTAL) BY MOUTH DAILY. 90 capsule 2  . ranolazine (RANEXA) 500 MG 12 hr tablet Take 1 tablet (500 mg total) by mouth 2 (two) times daily. 60 tablet 11  . ZETIA 10 MG tablet Take 10 mg by mouth daily.  12   No current facility-administered medications for this visit.    Social History   Social History  . Marital Status: Married    Spouse Name: N/A  . Number of Children: N/A  . Years of Education: N/A   Occupational History  . SELF EMPLOYED    Social  History Main Topics  . Smoking status: Former Smoker    Types: Cigarettes    Quit date: 07/30/2007  . Smokeless tobacco: Former Systems developer    Quit date: 09/23/2006     Comment: smoked 1980-1995; smoked 2005-2008. Maximum of 1.5 ppd  . Alcohol Use: No  . Drug Use: No  . Sexual Activity: Not on file   Other Topics Concern  . Not on file   Social History Narrative   NO REG EXERCISE          Family History  Problem Relation Age of Onset  . Diabetes Mother   .  Lung cancer Father     ex smoker  . Heart disease Neg Hx   . Stroke Neg Hx    Socially he is remarried. There is no recent tobacco or alcohol use.  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  ROS General: Negative; No fevers, chills, or night sweats;  HEENT: Negative; No changes in vision or hearing, sinus congestion, difficulty swallowing Pulmonary: Negative; No cough, wheezing, shortness of breath, hemoptysis Cardiovascular: Negative; No chest pain, presyncope, syncope, palpitations GI: Negative; No nausea, vomiting, diarrhea, or abdominal pain GU: Negative; No dysuria, hematuria, or difficulty voiding Musculoskeletal: Negative; no myalgias, joint pain, or weakness Hematologic/Oncology: Negative; no easy bruising, bleeding Endocrine: Negative; no heat/cold intolerance; previously he had metabolic syndrome and mild diabetes but this has normalized with diet. Neuro: Negative; no changes in balance, headaches Skin: Negative; No rashes or skin lesions Psychiatric: Negative; No behavioral problems, depression Sleep: Negative; No snoring, daytime sleepiness, hypersomnolence, bruxism, restless legs, hypnogognic hallucinations, no cataplexy Other comprehensive 14 point system review is negative.  PE BP 130/70 mmHg  Pulse 66  Ht _0  (1.651 m)  Wt 178 lb 3.2 oz (80.831 kg)  BMI 29.65 kg/m2  Wt Readings from Last 3 Encounters:  08/14/15 178 lb 3.2 oz (80.831 kg)  08/08/15  171 lb 9.6 oz (77.837 kg)  04/27/15 177 lb 14.4 oz (80.695 kg)   General: Alert, oriented, no distress.  Skin: normal turgor, no rashes HEENT: Normocephalic, atraumatic. Pupils round and reactive; sclera anicteric;no lid lag.  Nose without nasal septal hypertrophy Mouth/Parynx benign; Mallinpatti scale 2 Neck: No JVD, no carotid bruits; normal carotid upstroke Lungs: clear to ausculatation and percussion; no wheezing or rales Chest wall: Nontender to palpation Heart: RRR, s1 s2 normal 1/6 systolic murmur; no S3 or S4 gallop.  No diastolic murmurs rubs thrills or heaves. Abdomen: soft, nontender; no hepatosplenomehaly, BS+; abdominal aorta nontender and not dilated by palpation. Back: No CVA tenderness Pulses 2+ Extremities: no clubbing cyanosis or edema, Homan's sign negative  Neurologic: grossly nonfocal Psychologic: normal affect and mood.  ECG (independently read by me): Normal sinus rhythm at 66 bpm.  Nonspecific ST changes.  August 2016 ECG (independently read by me): Sinus bradycardia 55 bpm.  No ectopy.  Normal intervals.  November 2014 ECG: Normal sinus rhythm at 63 beats per minute. QTc interval 43 ms. Her restoration of normal  LABS:  BMP Latest Ref Rng 08/08/2015 08/07/2015 01/28/2014  Glucose 65 - 99 mg/dL 134(H) 101(H) 99  BUN 6 - 20 mg/dL _1 Creatinine 0.61 - 1.24 mg/dL 0.66 0.76 0.9  Sodium 135 - 145 mmol/L 138 138 139  Potassium 3.5 - 5.1 mmol/L 4.1 4.1 4.2  Chloride 101 - 111 mmol/L 104 105 104  CO2 22 - 32 mmol/L _2 Calcium 8.9 - 10.3 mg/dL 8.8(L) 9.4 9.4   Hepatic Function Latest Ref Rng 08/07/2015 01/28/2014 11/11/2010  Total Protein 6.5 - 8.1 g/dL 6.4(L) 6.8 6.9  Albumin 3.5 - 5.0 g/dL 4.0 4.0 4.0  AST 15 - 41 U/L 40 24 27  ALT 17 - 63 U/L 50 25 30  Alk Phosphatase 38 - 126 U/L 49 45 50  Total Bilirubin 0.3 - 1.2 mg/dL 0.8 0.7 0.4  Bilirubin, Direct 0.0 - 0.3 mg/dL - 0.1 -   CBC Latest Ref Rng 08/08/2015 08/07/2015 01/28/2014  WBC 4.0 - 10.5  K/uL 7.4 6.3 7.3  Hemoglobin 13.0 -  17.0 g/dL 14.2 15.8 14.7  Hematocrit 39.0 - 52.0 % 41.5 45.3 43.2  Platelets 150 - 400 K/uL 172 172 229.0   Lab Results  Component Value Date   MCV 86.6 08/08/2015   MCV 86.8 08/07/2015   MCV 87.3 01/28/2014   Lab Results  Component Value Date   TSH 0.928 06/13/2010   Lab Results  Component Value Date   HGBA1C 6.2 01/28/2014   Lipid Panel     Component Value Date/Time   CHOL  06/13/2010 0430    119        ATP III CLASSIFICATION:  <200     mg/dL   Desirable  200-239  mg/dL   Borderline High  >=240    mg/dL   High          TRIG 40 06/13/2010 0430   HDL 52 06/13/2010 0430   CHOLHDL 2.3 06/13/2010 0430   VLDL 8 06/13/2010 0430   LDLCALC  06/13/2010 0430    59        Total Cholesterol/HDL:CHD Risk Coronary Heart Disease Risk Table                     Men   Women  1/2 Average Risk   3.4   3.3  Average Risk       5.0   4.4  2 X Average Risk   9.6   7.1  3 X Average Risk  23.4   11.0        Use the calculated Patient Ratio above and the CHD Risk Table to determine the patient's CHD Risk.        ATP III CLASSIFICATION (LDL):  <100     mg/dL   Optimal  100-129  mg/dL   Near or Above                    Optimal  130-159  mg/dL   Borderline  160-189  mg/dL   High  >190     mg/dL   Very High     RADIOLOGY: No results found.    ASSESSMENT AND PLAN: Mr. Rafe Mackowski is a  66 year white male who is status post initial CABG revascularization surgery in 1995.  He suffered an an inferior wall myocardial infarction in August 2010, treated with successful RCA intervention, but due to recurrent restenosis issues required redo surgery in February 2012.  His last nuclear perfusion study in 2014 was normal.  When I last saw him several months ago.  He was completely asymptomatic.  His lipid studies were markedly improved since changing to a vegetarian diet.  Unfortunately, last week he developed recurrent symptomatology reminiscent of his prior  heart pain.  He was hospitalized.  Cardiac catheterization revealed new occlusion of the graft which was placed in 2012.  There was some collateralization to the RCA bed from the LAD system.  His LIMA was widely patent.  His native circumflex was well-perfused and the graft had been previously occluded.  He has felt improved with reinitiation of nitrate therapy but has not yet started Ranexa.  I provided him with samples of Ranexa 500 mg twice a day today.  He will take this dose for at least a month with ultimate plans to titrate this to 1000 mg twice a day when  I will see him when he comes back to the [place] area from [place] in 1-2 months for reevaluation.    Time spent: 25 minutes  Troy Sine, MD, [facility]  08/14/2015 6:58 PM

## 2015-08-28 ENCOUNTER — Ambulatory Visit: Payer: BLUE CROSS/BLUE SHIELD | Admitting: Cardiology

## 2015-09-07 ENCOUNTER — Ambulatory Visit: Payer: BLUE CROSS/BLUE SHIELD | Admitting: Physician Assistant

## 2015-10-26 ENCOUNTER — Other Ambulatory Visit: Payer: Self-pay | Admitting: *Deleted

## 2015-10-26 MED ORDER — ISOSORBIDE MONONITRATE ER 30 MG PO TB24: 30.0000 mg | ORAL_TABLET | Freq: Every day | ORAL | Status: DC

## 2015-10-26 MED ORDER — METOPROLOL TARTRATE 50 MG PO TABS: ORAL_TABLET | ORAL | Status: DC

## 2015-10-26 MED ORDER — RANOLAZINE ER 500 MG PO TB12: 500.0000 mg | ORAL_TABLET | Freq: Two times a day (BID) | ORAL | Status: AC

## 2015-10-26 MED ORDER — RAMIPRIL 5 MG PO CAPS: ORAL_CAPSULE | ORAL | Status: AC

## 2015-10-26 MED ORDER — NIACIN ER (ANTIHYPERLIPIDEMIC) 1000 MG PO TBCR: 1000.0000 mg | EXTENDED_RELEASE_TABLET | Freq: Every day | ORAL | Status: AC

## 2015-10-26 NOTE — Telephone Encounter (Signed)
Medication refills sent to [facility] pharmacy in [place], [place]. Patient relocated there. Enough medication filled until patient can find a local MD.

## 2015-11-02 ENCOUNTER — Other Ambulatory Visit: Payer: Self-pay | Admitting: Cardiovascular Disease

## 2015-11-02 NOTE — Telephone Encounter (Signed)
Rx request sent to pharmacy.  

## 2016-08-12 ENCOUNTER — Other Ambulatory Visit: Payer: Self-pay | Admitting: Cardiovascular Disease

## 2016-10-10 ENCOUNTER — Other Ambulatory Visit: Payer: Self-pay | Admitting: Cardiovascular Disease

## 2016-10-11 NOTE — Telephone Encounter (Signed)
Rx(s) sent to pharmacy electronically.  

## 2016-10-17 ENCOUNTER — Other Ambulatory Visit: Payer: Self-pay | Admitting: Cardiovascular Disease

## 2016-10-17 NOTE — Telephone Encounter (Signed)
Rx request sent to pharmacy.  

## 2016-10-21 ENCOUNTER — Other Ambulatory Visit: Payer: Self-pay | Admitting: Cardiovascular Disease

## 2016-11-07 ENCOUNTER — Other Ambulatory Visit: Payer: Self-pay | Admitting: Cardiovascular Disease

## 2016-11-07 NOTE — Telephone Encounter (Signed)
Rx(s) sent to pharmacy electronically.  

## 2016-11-26 ENCOUNTER — Other Ambulatory Visit: Payer: Self-pay | Admitting: Cardiovascular Disease

## 2017-02-01 ENCOUNTER — Encounter (HOSPITAL_COMMUNITY): Payer: Self-pay | Admitting: *Deleted

## 2017-02-01 ENCOUNTER — Emergency Department (HOSPITAL_COMMUNITY): Payer: PRIVATE HEALTH INSURANCE

## 2017-02-01 ENCOUNTER — Observation Stay (HOSPITAL_COMMUNITY)
Admission: EM | Admit: 2017-02-01 | Discharge: 2017-02-03 | DRG: 287 | Disposition: A | Discharge status: Home / Self Care | Payer: PRIVATE HEALTH INSURANCE | Attending: Internal Medicine | Admitting: Internal Medicine

## 2017-02-01 DIAGNOSIS — E119 Type 2 diabetes mellitus without complications: Secondary | ICD-10-CM | POA: Diagnosis not present

## 2017-02-01 DIAGNOSIS — I10 Essential (primary) hypertension: Secondary | ICD-10-CM | POA: Diagnosis not present

## 2017-02-01 DIAGNOSIS — I200 Unstable angina: Secondary | ICD-10-CM | POA: Diagnosis present

## 2017-02-01 DIAGNOSIS — Z833 Family history of diabetes mellitus: Secondary | ICD-10-CM | POA: Diagnosis not present

## 2017-02-01 DIAGNOSIS — I25110 Atherosclerotic heart disease of native coronary artery with unstable angina pectoris: Secondary | ICD-10-CM | POA: Diagnosis not present

## 2017-02-01 DIAGNOSIS — I2584 Coronary atherosclerosis due to calcified coronary lesion: Secondary | ICD-10-CM

## 2017-02-01 DIAGNOSIS — Z87891 Personal history of nicotine dependence: Secondary | ICD-10-CM

## 2017-02-01 DIAGNOSIS — E785 Hyperlipidemia, unspecified: Secondary | ICD-10-CM | POA: Diagnosis not present

## 2017-02-01 DIAGNOSIS — Z951 Presence of aortocoronary bypass graft: Secondary | ICD-10-CM | POA: Diagnosis not present

## 2017-02-01 DIAGNOSIS — Z955 Presence of coronary angioplasty implant and graft: Secondary | ICD-10-CM

## 2017-02-01 DIAGNOSIS — I252 Old myocardial infarction: Secondary | ICD-10-CM | POA: Diagnosis not present

## 2017-02-01 DIAGNOSIS — Z79899 Other long term (current) drug therapy: Secondary | ICD-10-CM

## 2017-02-01 DIAGNOSIS — I2510 Atherosclerotic heart disease of native coronary artery without angina pectoris: Secondary | ICD-10-CM | POA: Diagnosis present

## 2017-02-01 DIAGNOSIS — Z7982 Long term (current) use of aspirin: Secondary | ICD-10-CM | POA: Diagnosis not present

## 2017-02-01 DIAGNOSIS — I2583 Coronary atherosclerosis due to lipid rich plaque: Secondary | ICD-10-CM | POA: Diagnosis not present

## 2017-02-01 HISTORY — DX: Type 2 diabetes mellitus without complications: E11.9

## 2017-02-01 LAB — BASIC METABOLIC PANEL
Anion gap: 6 (ref 5–15)
BUN: 15 mg/dL (ref 6–20)
CHLORIDE: 107 mmol/L (ref 101–111)
CO2: 24 mmol/L (ref 22–32)
Calcium: 9.1 mg/dL (ref 8.9–10.3)
Creatinine, Ser: 0.86 mg/dL (ref 0.61–1.24)
Glucose, Bld: 111 mg/dL — ABNORMAL HIGH (ref 65–99)
POTASSIUM: 3.8 mmol/L (ref 3.5–5.1)
Sodium: 137 mmol/L (ref 135–145)

## 2017-02-01 LAB — CBC
HEMATOCRIT: 41.7 % (ref 39.0–52.0)
Hemoglobin: 14.5 g/dL (ref 13.0–17.0)
MCH: 29.4 pg (ref 26.0–34.0)
MCHC: 34.8 g/dL (ref 30.0–36.0)
MCV: 84.6 fL (ref 78.0–100.0)
PLATELETS: 178 10*3/uL (ref 150–400)
RBC: 4.93 MIL/uL (ref 4.22–5.81)
RDW: 12.6 % (ref 11.5–15.5)
WBC: 6.9 10*3/uL (ref 4.0–10.5)

## 2017-02-01 LAB — I-STAT TROPONIN, ED: Troponin i, poc: 0.03 ng/mL (ref 0.00–0.08)

## 2017-02-01 MED ORDER — HEPARIN BOLUS VIA INFUSION
4000.0000 [IU] | Freq: Once | INTRAVENOUS | Status: AC
  Administered 2017-02-01: 4000 [IU] via INTRAVENOUS
  Filled 2017-02-01: qty 4000

## 2017-02-01 MED ORDER — AMLODIPINE BESYLATE 5 MG PO TABS
5.0000 mg | ORAL_TABLET | Freq: Every day | ORAL | Status: DC
  Administered 2017-02-02 – 2017-02-03 (×2): 5 mg via ORAL
  Filled 2017-02-01 (×2): qty 1

## 2017-02-01 MED ORDER — ISOSORBIDE MONONITRATE ER 30 MG PO TB24
30.0000 mg | ORAL_TABLET | Freq: Every day | ORAL | Status: DC
  Administered 2017-02-01 – 2017-02-03 (×3): 30 mg via ORAL
  Filled 2017-02-01 (×3): qty 1

## 2017-02-01 MED ORDER — METOPROLOL TARTRATE 50 MG PO TABS
50.0000 mg | ORAL_TABLET | Freq: Two times a day (BID) | ORAL | Status: DC
  Administered 2017-02-01 – 2017-02-02 (×2): 50 mg via ORAL
  Filled 2017-02-01 (×3): qty 1

## 2017-02-01 MED ORDER — TICAGRELOR 90 MG PO TABS
90.0000 mg | ORAL_TABLET | Freq: Two times a day (BID) | ORAL | Status: DC
  Administered 2017-02-01 – 2017-02-03 (×4): 90 mg via ORAL
  Filled 2017-02-01 (×5): qty 1

## 2017-02-01 MED ORDER — SODIUM CHLORIDE 0.9 % IV SOLN: 250.0000 mL | INTRAVENOUS | Status: DC | PRN

## 2017-02-01 MED ORDER — ASPIRIN 300 MG RE SUPP: 300.0000 mg | RECTAL | Status: AC

## 2017-02-01 MED ORDER — RAMIPRIL 5 MG PO CAPS
5.0000 mg | ORAL_CAPSULE | Freq: Every day | ORAL | Status: DC
  Administered 2017-02-02 – 2017-02-03 (×2): 5 mg via ORAL
  Filled 2017-02-01 (×2): qty 1

## 2017-02-01 MED ORDER — ACETAMINOPHEN 325 MG PO TABS: 650.0000 mg | ORAL_TABLET | ORAL | Status: DC | PRN

## 2017-02-01 MED ORDER — ALPRAZOLAM 0.25 MG PO TABS: 0.2500 mg | ORAL_TABLET | Freq: Two times a day (BID) | ORAL | Status: DC | PRN

## 2017-02-01 MED ORDER — ONDANSETRON HCL 4 MG/2ML IJ SOLN: 4.0000 mg | Freq: Four times a day (QID) | INTRAMUSCULAR | Status: DC | PRN

## 2017-02-01 MED ORDER — ATORVASTATIN CALCIUM 40 MG PO TABS: 40.0000 mg | ORAL_TABLET | Freq: Every day | ORAL | Status: DC

## 2017-02-01 MED ORDER — FINASTERIDE 1 MG PO TABS: 1.0000 mg | ORAL_TABLET | Freq: Every day | ORAL | Status: DC

## 2017-02-01 MED ORDER — HEPARIN (PORCINE) IN NACL 100-0.45 UNIT/ML-% IJ SOLN
1100.0000 [IU]/h | INTRAMUSCULAR | Status: DC
  Administered 2017-02-01 – 2017-02-02 (×2): 1100 [IU]/h via INTRAVENOUS
  Filled 2017-02-01 (×2): qty 250

## 2017-02-01 MED ORDER — ASPIRIN 81 MG PO CHEW
324.0000 mg | CHEWABLE_TABLET | ORAL | Status: AC
  Administered 2017-02-01: 324 mg via ORAL
  Filled 2017-02-01: qty 4

## 2017-02-01 MED ORDER — ASPIRIN EC 81 MG PO TBEC
81.0000 mg | DELAYED_RELEASE_TABLET | Freq: Every day | ORAL | Status: DC
  Administered 2017-02-02: 81 mg via ORAL
  Filled 2017-02-01: qty 1

## 2017-02-01 MED ORDER — EZETIMIBE 10 MG PO TABS
10.0000 mg | ORAL_TABLET | Freq: Every day | ORAL | Status: DC
  Administered 2017-02-01 – 2017-02-03 (×3): 10 mg via ORAL
  Filled 2017-02-01 (×3): qty 1

## 2017-02-01 MED ORDER — RANOLAZINE ER 500 MG PO TB12
500.0000 mg | ORAL_TABLET | Freq: Two times a day (BID) | ORAL | Status: DC
  Administered 2017-02-01 – 2017-02-03 (×3): 500 mg via ORAL
  Filled 2017-02-01 (×4): qty 1

## 2017-02-01 MED ORDER — ROSUVASTATIN CALCIUM 10 MG PO TABS
40.0000 mg | ORAL_TABLET | Freq: Every day | ORAL | Status: DC
  Administered 2017-02-01 – 2017-02-02 (×2): 40 mg via ORAL
  Filled 2017-02-01 (×2): qty 4

## 2017-02-01 MED ORDER — ASPIRIN 81 MG PO TABS: 81.0000 mg | ORAL_TABLET | Freq: Every day | ORAL | Status: DC

## 2017-02-01 MED ORDER — FINASTERIDE 1 MG PO TABS
1.0000 mg | ORAL_TABLET | Freq: Every day | ORAL | Status: DC
  Filled 2017-02-01 (×4): qty 1

## 2017-02-01 MED ORDER — NITROGLYCERIN 0.4 MG SL SUBL: 0.4000 mg | SUBLINGUAL_TABLET | SUBLINGUAL | Status: DC | PRN

## 2017-02-01 MED ORDER — SODIUM CHLORIDE 0.9% FLUSH
3.0000 mL | Freq: Two times a day (BID) | INTRAVENOUS | Status: DC
  Administered 2017-02-01: 3 mL via INTRAVENOUS

## 2017-02-01 MED ORDER — SODIUM CHLORIDE 0.9% FLUSH: 3.0000 mL | INTRAVENOUS | Status: DC | PRN

## 2017-02-01 MED ORDER — ACETAMINOPHEN 325 MG PO TABS
650.0000 mg | ORAL_TABLET | ORAL | Status: DC | PRN
  Administered 2017-02-02: 650 mg via ORAL
  Filled 2017-02-01: qty 2

## 2017-02-01 NOTE — ED Triage Notes (Addendum)
To ED via POV for eval of midsternal chest burning with any exertion. States pain is same as he had in 1995 when it 'was my LAD'. Pt now lives in [place] and drove up yesterday to see his mom. Pain started about a week ago... First noted when walking up steps. No pain now but states if he were to walk across the room he would have pain. Pt took full asa today

## 2017-02-01 NOTE — ED Provider Notes (Signed)
[facility] Provider Note   CSN: [id number] Arrival date & time: 02/01/17  1217     History   Chief Complaint Chief Complaint  [id number] presents with  . Chest Pain    HPI Seth Sanchez is a 66 y.o. male who presents with chest pain. PMH significant for CAD s/p CABG, diet controlled Type 2 DM, dyslipidemia, HTN. He states over the past week he has had worsening chest pressure with exertion. States it feels similar to when he had a blockage in his LAD in 1995. Pain feels like a burning. It has been getting worse over the past week and this morning the pain became more severe therefore he came to the ED for further evaluation. He states he is has seen Dr. Tresa Sanchez in the past however he moved to [place] a year and a half ago and has is established with cardiologists there. He reports associated lightheadedness without syncope. Denies fever, shortness of breath, cough, abdominal pain, nausea or vomiting. He took a full strength aspirin this morning. His last cath was in November 2016. Medical management was recommended. He denies frequent anginal symptoms.  HPI  Past Medical History:  Diagnosis Date  . CAD (coronary artery disease)    a. LIMA-LAD, SVG-OM (known to be occluded), SVG-RCA in 1995 b. redo SVG-RCA in 2012  . Depression   . Depression 1995   post CABG  . Diabetes mellitus without complication ([facility])   . Diaphragmatic disorder    MI  . Dyslipidemia   . MI (myocardial infarction) ([facility])    diaphramatic- Dr. Daphene Jaegerom Aison Sanchez  . Vitamin D deficiency     Patient Active Problem List   Diagnosis Date Noted  . CAD involving CABG of native heart with unstable angina pectoris ([facility])   . Unstable angina ([facility]) 08/07/2015  . Hyperlipidemia LDL goal <70 04/29/2015  . Metabolic syndrome 04/29/2015  . Diabetes mellitus- diet controlled 02/09/2014  . CABG x 2 '95. Multiple RCA PCI's- Re Do CABG x 1 2012. Low risk Myoview Jan 2014 07/29/2013  . Essential hypertension 07/29/2013  . Type  II or unspecified type diabetes mellitus with peripheral circulatory disorders, not stated as uncontrolled(250.70) 07/25/2010  . VITAMIN D DEFICIENCY 09/29/2009  . PROSTATE ASYMMETRY, LEFT 09/29/2009  . Dyslipidemia 06/29/2009    Past Surgical History:  Procedure Laterality Date  . CARDIAC CATHETERIZATION     x5  . CARDIAC CATHETERIZATION N/A 08/08/2015   Procedure: Left Heart Cath and Coronary Angiography;  Surgeon: Lennette Biharihomas A Yanilen Adamik, MD;  [facility];  Service: Cardiovascular;  Laterality: N/A;  . CORONARY ARTERY BYPASS GRAFT  1995   R coronary artery stent  . CORONARY ARTERY BYPASS GRAFT  2013       Home Medications    Prior to Admission medications   Medication Sig Start Date End Date Taking? Authorizing Provider  amLODipine (NORVASC) 5 MG tablet Take 5 mg by mouth daily. 01/08/17  Yes [provider]  aspirin 81 MG tablet Take 81 mg by mouth daily.   Yes [provider]  Cholecalciferol (VITAMIN D-3) 5000 UNITS TABS Take 5,000 Units by mouth daily.    Yes [provider]  Coenzyme Q10 (COQ10) 100 MG CAPS Take 100 mg by mouth daily.    Yes [provider]  finasteride (PROPECIA) 1 MG tablet Take 1 mg by mouth daily.     Yes [provider]  isosorbide mononitrate (IMDUR) 30 MG 24 hr tablet Take 1 tablet (30 mg total)  by mouth daily. 10/26/15  Yes Lennette Bihari, MD  metoprolol (LOPRESSOR) 50 MG tablet TAKE 1 TABLET(50 MG) BY MOUTH TWICE DAILY 10/21/16  Yes Lennette Bihari, MD  Multiple Vitamins-Minerals (EYE VITAMINS PO) Take 1 tablet by mouth daily.   Yes [provider]  rosuvastatin (CRESTOR) 40 MG tablet Take 40 mg by mouth daily. 01/09/17  Yes [provider]  ZETIA 10 MG tablet Take 10 mg by mouth daily. 04/15/15  Yes [provider]  acetaminophen (TYLENOL) 325 MG tablet Take 2 tablets (650 mg total) by mouth every 4 (four) hours as needed for headache or mild pain. 08/08/15   Abelino Derrick,  PA-C  atorvastatin (LIPITOR) 40 MG tablet Take 1 tablet (40 mg total) by mouth daily. <PLEASE MAKE APPOINTMENT FOR REFILLS> Patient not taking: Reported on 02/01/2017 11/07/16   Lennette Bihari, MD  niacin (NIASPAN) 1000 MG CR tablet Take 1 tablet (1,000 mg total) by mouth at bedtime. Patient not taking: Reported on 02/01/2017 10/26/15   Lennette Bihari, MD  nitroGLYCERIN (NITROSTAT) 0.4 MG SL tablet Place 1 tablet (0.4 mg total) under the tongue every 5 (five) minutes as needed for chest pain. 08/26/14   Lennette Bihari, MD  ramipril (ALTACE) 5 MG capsule TAKE 1 CAPSULE (5 MG TOTAL) BY MOUTH DAILY. Patient not taking: Reported on 02/01/2017 10/26/15   Lennette Bihari, MD  ranolazine (RANEXA) 500 MG 12 hr tablet Take 1 tablet (500 mg total) by mouth 2 (two) times daily. Patient not taking: Reported on 02/01/2017 10/26/15   Lennette Bihari, MD    Family History Family History  Problem Relation Age of Onset  . Diabetes Mother   . Lung cancer Father        ex smoker  . Heart disease Neg Hx   . Stroke Neg Hx     Social History Social History  Substance Use Topics  . Smoking status: Former Smoker    Types: Cigarettes    Quit date: 07/30/2007  . Smokeless tobacco: Former Neurosurgeon    Quit date: 09/23/2006     Comment: smoked 1980-1995; smoked 2005-2008. Maximum of 1.5 ppd  . Alcohol use No     Allergies   Patient has no known allergies.   Review of Systems Review of Systems  Constitutional: Negative for chills and fever.  Respiratory: Negative for cough and shortness of breath.   Cardiovascular: Positive for chest pain. Negative for palpitations and leg swelling.  Gastrointestinal: Negative for abdominal pain, nausea and vomiting.  Neurological: Positive for light-headedness. Negative for syncope.  All other systems reviewed and are negative.    Physical Exam Updated Vital Signs BP (!) 141/76   Pulse 62   Temp 98.7 F (37.1 C) (Oral)   Resp 14   SpO2 99%   Physical Exam    Constitutional: He is oriented to person, place, and time. He appears well-developed and well-nourished. No distress.  HENT:  Head: Normocephalic and atraumatic.  Eyes: Conjunctivae are normal. Pupils are equal, round, and reactive to light. Right eye exhibits no discharge. Left eye exhibits no discharge. No scleral icterus.  Neck: Normal range of motion.  Cardiovascular: Normal rate and regular rhythm.  Exam reveals no gallop and no friction rub.   No murmur heard. Pulmonary/Chest: Effort normal and breath sounds normal. No respiratory distress. He has no wheezes. He has no rales. He exhibits no tenderness.  Abdominal: Soft. Bowel sounds are normal. He exhibits no distension and no mass.  There is no tenderness. There is no rebound and no guarding. No hernia.  Neurological: He is alert and oriented to person, place, and time.  Skin: Skin is warm and dry.  Psychiatric: He has a normal mood and affect. His behavior is normal.  Nursing note and vitals reviewed.    ED Treatments / Results  Labs (all labs ordered are listed, but only abnormal results are displayed) Labs Reviewed  BASIC METABOLIC PANEL - Abnormal; Notable for the following:       Result Value   Glucose, Bld 111 (*)    All other components within normal limits  CBC  I-STAT TROPOININ, ED    EKG  EKG Interpretation None       Radiology Dg Chest 2 View  Result Date: 02/01/2017 CLINICAL DATA:  Chest pain for 2 days. History of hypertension, diabetes, coronary artery disease, MI. EXAM: CHEST  2 VIEW COMPARISON:  Chest x-rays dated 08/07/2015 10/04/2012. FINDINGS: Heart size and mediastinal contours are stable. Median sternotomy wires appear intact and stable in alignment. Lungs are clear. No pleural effusion or pneumothorax seen. No acute or suspicious osseous finding. IMPRESSION: No active cardiopulmonary disease. No evidence of pneumonia or pulmonary edema. Electronically Signed   By: Bary Richard M.D.   On: 02/01/2017  14:05    Procedures Procedures (including critical care time)  Medications Ordered in ED Medications - No data to display   Initial Impression / Assessment and Plan / ED Course  I have reviewed the triage vital signs and the nursing notes.  Pertinent labs & imaging results that were available during my care of the patient were reviewed by me and considered in my medical decision making (see chart for details).  66 year old male with symptoms concerning for unstable angina. He is hypertensive otherwise vitals are normal. Labs are unremarkable. EKG is NSR. Troponin is 0.03. CXR unremarkable. HEART score is >4. Spoke with Dr. Gala Romney who will come to see patient.  Final Clinical Impressions(s) / ED Diagnoses   Final diagnoses:  Unstable angina [facility])    New Prescriptions New Prescriptions   No medications on file     Beryle Quant 02/01/17 1505    Geoffery Lyons, MD 02/02/17 [phone]

## 2017-02-01 NOTE — H&P (Signed)
Cardiology H and P    Patient ID: Seth Sanchez MRN: [id number], DOB/AGE: 01/27/1951   Admit date: 02/01/2017 Date of Consult: 02/01/2017  Primary Physician: System, Pcp Not In Primary Cardiologist: Dr. [name]  Reason for Admission: unstable angina  Patient Profile    Seth Sanchez is a 66 yo male with significant CAD history s/p CABG x 2 (LIMA-LAD, SVG-OM, 1995), multiple PCIs, re-do CABG x 1 (SVG-RCA, 2012), most recent left heart cath in 2016 with new occlusion of SVG-RCA with patent LIMA-LAD with collaterals to the RCA that was medically treated at that time, HTN, HLD, and DM. [facility].   Past Medical History   Past Medical History:  Diagnosis Date  . CAD (coronary artery disease)    a. LIMA-LAD, SVG-OM (known to be occluded), SVG-RCA in 1995 b. redo SVG-RCA in 2012  . Depression   . Depression 1995   post CABG  . Diabetes mellitus without complication (HCC)   . Diaphragmatic disorder    MI  . Dyslipidemia   . MI (myocardial infarction) (HCC)    diaphramatic- Dr. Daphene Jaegerom Kelly  . Vitamin D deficiency     Past Surgical History:  Procedure Laterality Date  . CARDIAC CATHETERIZATION     x5  . CARDIAC CATHETERIZATION N/A 08/08/2015   Procedure: Left Heart Cath and Coronary Angiography;  Surgeon: Lennette Biharihomas A Kelly, MD;  [facility];  Service: Cardiovascular;  Laterality: N/A;  . CORONARY ARTERY BYPASS GRAFT  1995   R coronary artery stent  . CORONARY ARTERY BYPASS GRAFT  2013     Allergies  No Known Allergies  History of Present Illness    Seth Sanchez is a 66 yo male with significant CAD history s/p CABG x 2 (LIMA-LAD, SVG-OM, 1995), multiple PCIs, re-do CABG x 1 (SVG-RCA, 2012), most recent left heart cath in 2016 with new occlusion of SVG-RCA with patent LIMA-LAD with collaterals to the RCA that was medically treated at that time, HTN, HLD, and DM.  [place] visiting for Mother's Day. He walks regularly  and is compliant on his medications. On my interview, he states he has had some mild chest discomfort over the past week that occurred at rest and with activity that spontaneously resolved. He had misplaced his nitro SL last week and was unable to take nitro. Yesterday while walking, he felt chest pain on the left side that did not radiate anywhere and was not associated with diaphoresis, N/V, palpitations, or SOB, but was relieved with rest. He walked again with a recurrence of the chest pain that again was relieved with rest. Today, he was walking up stairs and felt the chest pain with some increased intensity rated as a 7/10 and felt like his MI in 1995. [facility]. He is currently chest pain free.   Inpatient Medications       Outpatient Medications    Prior to Admission medications   Medication Sig Start Date End Date Taking? Authorizing Provider  amLODipine (NORVASC) 5 MG tablet Take 5 mg by mouth daily. 01/08/17  Yes [provider]  aspirin 81 MG tablet Take 81 mg by mouth daily.   Yes [provider]  Cholecalciferol (VITAMIN D-3) 5000 UNITS TABS Take 5,000 Units by mouth daily.    Yes [provider]  Coenzyme Q10 (COQ10) 100 MG CAPS Take 100 mg by mouth daily.    Yes [provider]  finasteride (PROPECIA) 1 MG tablet Take 1 mg by mouth daily.     Yes [provider]  isosorbide mononitrate (IMDUR) 30 MG 24 hr tablet Take 1 tablet (30 mg total) by mouth daily. 10/26/15  Yes Lennette Bihari, MD  metoprolol (LOPRESSOR) 50 MG tablet TAKE 1 TABLET(50 MG) BY MOUTH TWICE DAILY 10/21/16  Yes Lennette Bihari, MD  Multiple Vitamins-Minerals (EYE VITAMINS PO) Take 1 tablet by mouth daily.   Yes [provider]  rosuvastatin (CRESTOR) 40 MG tablet Take 40 mg by mouth daily. 01/09/17  Yes [provider]  ZETIA 10 MG tablet Take 10 mg by mouth daily. 04/15/15  Yes [provider]  acetaminophen  (TYLENOL) 325 MG tablet Take 2 tablets (650 mg total) by mouth every 4 (four) hours as needed for headache or mild pain. 08/08/15   Abelino Derrick, PA-C  atorvastatin (LIPITOR) 40 MG tablet Take 1 tablet (40 mg total) by mouth daily. <PLEASE MAKE APPOINTMENT FOR REFILLS> Patient not taking: Reported on 02/01/2017 11/07/16   Lennette Bihari, MD  niacin (NIASPAN) 1000 MG CR tablet Take 1 tablet (1,000 mg total) by mouth at bedtime. Patient not taking: Reported on 02/01/2017 10/26/15   Lennette Bihari, MD  nitroGLYCERIN (NITROSTAT) 0.4 MG SL tablet Place 1 tablet (0.4 mg total) under the tongue every 5 (five) minutes as needed for chest pain. 08/26/14   Lennette Bihari, MD  ramipril (ALTACE) 5 MG capsule TAKE 1 CAPSULE (5 MG TOTAL) BY MOUTH DAILY. Patient not taking: Reported on 02/01/2017 10/26/15   Lennette Bihari, MD  ranolazine (RANEXA) 500 MG 12 hr tablet Take 1 tablet (500 mg total) by mouth 2 (two) times daily. Patient not taking: Reported on 02/01/2017 10/26/15   Lennette Bihari, MD     Family History    Family History  Problem Relation Age of Onset  . Diabetes Mother   . Lung cancer Father        ex smoker  . Heart disease Neg Hx   . Stroke Neg Hx     Social History    Social History   Social History  . Marital status: Married    Spouse name: N/A  . Number of children: N/A  . Years of education: N/A   Occupational History  . SELF EMPLOYED [employer]   Social History Main Topics  . Smoking status: Former Smoker    Types: Cigarettes    Quit date: 07/30/2007  . Smokeless tobacco: Former Neurosurgeon    Quit date: 09/23/2006     Comment: smoked 1980-1995; smoked 2005-2008. Maximum of 1.5 ppd  . Alcohol use No  . Drug use: No  . Sexual activity: Not on file   Other Topics Concern  . Not on file   Social History Narrative   NO REG EXERCISE           Review of Systems    General:  No chills, fever, night sweats or weight changes.  Cardiovascular:  + chest pain, no  dyspnea on exertion, edema, orthopnea, palpitations, paroxysmal nocturnal dyspnea. Dermatological: No rash, lesions/masses Respiratory: No cough, dyspnea Urologic: No hematuria, dysuria Abdominal:   No nausea, vomiting, diarrhea, bright red blood per rectum, melena, or hematemesis Neurologic:  No visual changes, changes in mental status. All other systems reviewed and are otherwise negative except as noted above.  Physical Exam    Blood pressure 134/66, pulse (!) 56, temperature 98.7 F (37.1  C), temperature source Oral, resp. rate 15, SpO2 98 %.  General: Pleasant, NAD Psych: Normal affect. Neuro: Alert and oriented X 3. Moves all extremities spontaneously. HEENT: Normal  Neck: Supple without bruits or JVD. Lungs:  Resp regular and unlabored, CTA. Heart: RRR no s3, s4, or murmurs. Abdomen: Soft, non-tender, non-distended, BS + x 4.  Extremities: No clubbing, cyanosis or edema. DP/PT/Radials 2+ and equal bilaterally.  Labs    Troponin [facility] Test)  Recent Labs  02/01/17 1238  TROPIPOC 0.03   No results for input(s): CKTOTAL, CKMB, TROPONINI in the last 72 hours. Lab Results  Component Value Date   WBC 6.9 02/01/2017   HGB 14.5 02/01/2017   HCT 41.7 02/01/2017   MCV 84.6 02/01/2017   PLT 178 02/01/2017    Recent Labs Lab 02/01/17 1233  NA 137  K 3.8  CL 107  CO2 24  BUN 15  CREATININE 0.86  CALCIUM 9.1  GLUCOSE 111*   Lab Results  Component Value Date   CHOL  06/13/2010    119        ATP III CLASSIFICATION:  <200     mg/dL   Desirable  563-875  mg/dL   Borderline High  >=643    mg/dL   High          HDL 52 06/13/2010   LDLCALC  06/13/2010    59        Total Cholesterol/HDL:CHD Risk Coronary Heart Disease Risk Table                     Men   Women  1/2 Average Risk   3.4   3.3  Average Risk       5.0   4.4  2 X Average Risk   9.6   7.1  3 X Average Risk  23.4   11.0        Use the calculated Patient Ratio above and the CHD Risk Table to  determine the patient's CHD Risk.        ATP III CLASSIFICATION (LDL):  <100     mg/dL   Optimal  329-518  mg/dL   Near or Above                    Optimal  130-159  mg/dL   Borderline  841-660  mg/dL   High  >630     mg/dL   Very High   TRIG 40 16/09/930   No results found for: [facility]   Radiology Studies    Dg Chest 2 View  Result Date: 02/01/2017 CLINICAL DATA:  Chest pain for 2 days. History of hypertension, diabetes, coronary artery disease, MI. EXAM: CHEST  2 VIEW COMPARISON:  Chest x-rays dated 08/07/2015 10/04/2012. FINDINGS: Heart size and mediastinal contours are stable. Median sternotomy wires appear intact and stable in alignment. Lungs are clear. No pleural effusion or pneumothorax seen. No acute or suspicious osseous finding. IMPRESSION: No active cardiopulmonary disease. No evidence of pneumonia or pulmonary edema. Electronically Signed   By: Bary Richard M.D.   On: 02/01/2017 14:05    ECG & Cardiac Imaging    EKG 02/01/17: NSR  Left heart cath 08/08/15:  Prox RCA to Mid RCA lesion, 100% stenosed. The lesion was previously treated with a stent (unknown type).  Mid LAD lesion, 100% stenosed.  SVG was injected .  There is severe disease in the graft.  Prox Graft lesion, 100% stenosed.  LIMA was injected is normal in caliber, and is anatomically normal.  The left ventricular systolic function is normal.   Low-normal global ejection fraction at 50-55% with only a very small region of very mild inferobasal hypocontractility.  Native coronary obstructive disease with old total occlusion of the LAD immediately after the takeoff of the first diagonal and septal perforating artery; normal-appearing left circumflex vessel; and old total occlusion of the native proximal RCA at the stented segments.  There is evidence for faint collateralization to the distal RCA from the circumflex vessel, and collateralization to RV marginal branches from the LIMA to LAD  injection.  Widely patent LIMA graft supplying the LAD with evidence for collateralization to the stented segment of the RCA via marginal branches.  New ostial occlusion of the vein graft from 2012 which had supplied the RCA.  Previously documented occlusion of the graft which had supplied and OM 2 vessel.  RECOMMENDATION: The vein graft from Mr. Carneiro's  2012 surgery which had supplied his native RCA has most likely occluded recently at the ostium, which accounts for his recurrent anginal symptomatology.  His LV function is fairly well preserved.  Increased medical regimen is recommended with the addition of nitrates and ranolazine to be added his beta blocker and Ace inhibition.  Aggressive statin therapy.  Assessment & Plan    1. CAD with unstable angina s/p CABG x 2, re-do CABG x 1, LHC 07/2015 with occlused SVG-RCA that was medically managed - troponin POC 0.03 - EKG without signs of ischemia - given his history of CAD and symptoms of unstable angina, will admit to cardiology and trend troponins - will plan for left heart cath on Monday - heparin gtt - continue home medications ASA, norvasc, imdur, lopressor, ramipril, crestor, and ranexa   2. HTN - continue home medications as above   3. DM - will start SSI - currently diet controlled - last A1c 6.2 (2015) - repeat A1c pending    Signed, Marcelino Duster, PA-C 02/01/2017, 3:16 PM [phone]  Patient seen and examined with the above-signed Advanced Practice Provider and/or Housestaff. I personally reviewed laboratory data, imaging studies and relevant notes. I independently examined the patient and formulated the important aspects of the plan. I have edited the note to reflect any of my changes or salient points. I have personally discussed the plan with the patient and/or family.  66 y/o male with severe CAD and re-do CABG presents with typical angina. ECG and troponin ok. Now pain free. Will admit for cath on  Monday.   Start heparin and Brilinta. Continue b-blocker, statin, imdur and Ranexa.   Arvilla Meres, MD  5:14 PM

## 2017-02-01 NOTE — Progress Notes (Signed)
ANTICOAGULATION CONSULT NOTE - Initial Consult  Pharmacy Consult for heparin Indication: chest pain/ACS  No Known Allergies  Patient Measurements: Height: 5\' 5"  (165.1 cm) Weight: 177 lb (80.3 kg) IBW/kg (Calculated) : 61.5 Heparin Dosing Weight: 78kg  Vital Signs: Temp: 98.3 F (36.8 C) (05/12 1817) Temp Source: Oral (05/12 1817) BP: 104/83 (05/12 1817) Pulse Rate: 64 (05/12 1817)  Labs:  Recent Labs  02/01/17 1233  HGB 14.5  HCT 41.7  PLT 178  CREATININE 0.86    Estimated Creatinine Clearance: 84.7 mL/min (by C-G formula based on SCr of 0.86 mg/dL).   Assessment: [age] YOM with significant cardiac history presenting with chest pain, to start heparin for ACS.  Not on anticoagulation PTA.  Baseline CBC wnl, no bleeding noted.   Goal of Therapy:  Heparin level 0.3-0.7 units/ml Monitor platelets by anticoagulation protocol: Yes   Plan:  Heparin bolus with 4000 units IV x1, then start infusion at 1100 units/hr Heparin level in 6 hours Daily heparin level and CBC Plans for cath Monday   Heaton Sarin D. Mahina Salatino, PharmD, BCPS Clinical Pharmacist Pager: [phone] 02/01/2017 6:49 PM

## 2017-02-02 DIAGNOSIS — I10 Essential (primary) hypertension: Secondary | ICD-10-CM

## 2017-02-02 LAB — CBC
HCT: 39 % (ref 39.0–52.0)
HEMATOCRIT: 38.1 % — AB (ref 39.0–52.0)
HEMOGLOBIN: 13.1 g/dL (ref 13.0–17.0)
Hemoglobin: 13.3 g/dL (ref 13.0–17.0)
MCH: 29.6 pg (ref 26.0–34.0)
MCH: 29.4 pg (ref 26.0–34.0)
MCHC: 34.4 g/dL (ref 30.0–36.0)
MCHC: 34.1 g/dL (ref 30.0–36.0)
MCV: 86.2 fL (ref 78.0–100.0)
MCV: 86.3 fL (ref 78.0–100.0)
Platelets: 159 10*3/uL (ref 150–400)
Platelets: 165 K/uL (ref 150–400)
RBC: 4.42 MIL/uL (ref 4.22–5.81)
RBC: 4.52 MIL/uL (ref 4.22–5.81)
RDW: 12.8 % (ref 11.5–15.5)
RDW: 13 % (ref 11.5–15.5)
WBC: 7.1 10*3/uL (ref 4.0–10.5)
WBC: 7.2 K/uL (ref 4.0–10.5)

## 2017-02-02 LAB — BASIC METABOLIC PANEL
ANION GAP: 7 (ref 5–15)
BUN: 15 mg/dL (ref 6–20)
CHLORIDE: 106 mmol/L (ref 101–111)
CO2: 25 mmol/L (ref 22–32)
Calcium: 8.8 mg/dL — ABNORMAL LOW (ref 8.9–10.3)
Creatinine, Ser: 0.96 mg/dL (ref 0.61–1.24)
GFR calc non Af Amer: >60 mL/min (ref >60)
GLUCOSE: 132 mg/dL — AB (ref 65–99)
Potassium: 4.1 mmol/L (ref 3.5–5.1)
Sodium: 138 mmol/L (ref 135–145)

## 2017-02-02 LAB — LIPID PANEL
Cholesterol: 110 mg/dL (ref 0–200)
HDL: 43 mg/dL (ref >40)
LDL CALC: 53 mg/dL (ref 0–99)
TRIGLYCERIDES: 69 mg/dL (ref <150)
Total CHOL/HDL Ratio: 2.6 RATIO
VLDL: 14 mg/dL (ref 0–40)

## 2017-02-02 LAB — HEPARIN LEVEL (UNFRACTIONATED)
Heparin Unfractionated: 0.63 IU/mL (ref 0.30–0.70)
Heparin Unfractionated: 0.63 IU/mL (ref 0.30–0.70)

## 2017-02-02 MED ORDER — SODIUM CHLORIDE 0.9 % WEIGHT BASED INFUSION: 1.0000 mL/kg/h | INTRAVENOUS | Status: DC

## 2017-02-02 MED ORDER — SODIUM CHLORIDE 0.9 % IV SOLN: 250.0000 mL | INTRAVENOUS | Status: DC | PRN

## 2017-02-02 MED ORDER — SODIUM CHLORIDE 0.9% FLUSH: 3.0000 mL | INTRAVENOUS | Status: DC | PRN

## 2017-02-02 MED ORDER — ASPIRIN 81 MG PO CHEW
81.0000 mg | CHEWABLE_TABLET | ORAL | Status: AC
  Administered 2017-02-03: 81 mg via ORAL
  Filled 2017-02-02: qty 1

## 2017-02-02 MED ORDER — SODIUM CHLORIDE 0.9% FLUSH: 3.0000 mL | Freq: Two times a day (BID) | INTRAVENOUS | Status: DC

## 2017-02-02 MED ORDER — SODIUM CHLORIDE 0.9 % WEIGHT BASED INFUSION
3.0000 mL/kg/h | INTRAVENOUS | Status: DC
  Administered 2017-02-03: 3 mL/kg/h via INTRAVENOUS

## 2017-02-02 NOTE — Progress Notes (Signed)
ANTICOAGULATION CONSULT NOTE - Follow Up Consult  Pharmacy Consult for Heparin  Indication: chest pain/ACS  No Known Allergies  Patient Measurements: Height: 5\' 5"  (165.1 cm) Weight: 175 lb 12.8 oz (79.7 kg) IBW/kg (Calculated) : 61.5  Vital Signs: Temp: 97 F (36.1 C) (05/13 0533) BP: 117/69 (05/13 1143) Pulse Rate: 63 (05/13 1143)  Labs:  Recent Labs  02/01/17 1233 02/02/17 0106 02/02/17 0656 02/02/17 1151  HGB 14.5 13.1 13.3  --   HCT 41.7 38.1* 39.0  --   PLT 178 159 165  --   HEPARINUNFRC  --  0.63  --  0.63  CREATININE 0.86  --  0.96  --     Estimated Creatinine Clearance: 75.6 mL/min (by C-G formula based on SCr of 0.96 mg/dL).   Assessment: [age] YOM with significant cardiac history presenting with chest pain, to start heparin for ACS.  Not on anticoagulation PTA. Heparin level therapeutic at 0.63. CBC wnl, no bleeding noted.  Goal of Therapy:  Heparin level 0.3-0.7 units/ml Monitor platelets by anticoagulation protocol: Yes   Plan:  Continue heparin gtt at 1100 units/hr Daily heparin level and CBC Monitor for s/s bleeding F/u plans post-cath Monday    York CeriseKatherine Cook, PharmD Pharmacy Resident  Pager [phone] 02/02/17 12:58 PM

## 2017-02-02 NOTE — Plan of Care (Signed)
Problem: Pain Managment: Goal: General experience of comfort will improve Outcome: Progressing No complain of pain or discomfort.   

## 2017-02-02 NOTE — Progress Notes (Signed)
CARDIOLOGY PROGRESS NOTE Subjective:    Remains CP free. No dyspnea. Tolerating heparin without bleeding.   Troponins remain negative.    Intake/Output Summary (Last 24 hours) at 02/02/17 0957 Last data filed at 02/02/17 0534  Gross per 24 hour  Intake               33 ml  Output                0 ml  Net               33 ml    Current meds: . amLODipine  5 mg Oral Daily  . aspirin EC  81 mg Oral Daily  . ezetimibe  10 mg Oral Daily  . finasteride  1 mg Oral Daily  . isosorbide mononitrate  30 mg Oral Daily  . metoprolol  50 mg Oral BID  . ramipril  5 mg Oral Daily  . ranolazine  500 mg Oral BID  . rosuvastatin  40 mg Oral q1800  . sodium chloride flush  3 mL Intravenous Q12H  . ticagrelor  90 mg Oral BID   Infusions: . sodium chloride    . heparin 1,100 Units/hr (02/01/17 2025)     Objective:  Blood pressure (!) 102/56, pulse (!) 57, temperature 97 F (36.1 C), resp. rate 15, height 5\' 5"  (1.651 m), weight 79.7 kg (175 lb 12.8 oz), SpO2 98 %. Weight change:   Physical Exam: General:  Well appearing. No resp difficulty HEENT: normal Neck: supple. JVP 5-6 . Carotids 2+ bilat; no bruits. No lymphadenopathy or thryomegaly appreciated. Cor: CABG scar  PMI nondisplaced. Regular rate & rhythm. No rubs, gallops or murmurs. Lungs: clear no wheeze Abdomen: soft, nontender, nondistended. No hepatosplenomegaly. No bruits or masses. Good bowel sounds. Extremities: no cyanosis, clubbing, rash, edema Neuro: alert & orientedx3, cranial nerves grossly intact. moves all 4 extremities w/o difficulty. Affect pleasant  Telemetry: NSR 60s Personally reviewed   Lab Results: Basic Metabolic Panel:  Recent Labs Lab 02/01/17 1233 02/02/17 0656  NA 137 138  K 3.8 4.1  CL 107 106  CO2 24 25  GLUCOSE 111* 132*  BUN 15 15  CREATININE 0.86 0.96  CALCIUM 9.1 8.8*   Liver Function Tests: No results for input(s): AST, ALT, ALKPHOS, BILITOT, PROT, ALBUMIN in the last 168  hours. No results for input(s): LIPASE, AMYLASE in the last 168 hours. No results for input(s): AMMONIA in the last 168 hours. CBC:  Recent Labs Lab 02/01/17 1233 02/02/17 0106 02/02/17 0656  WBC 6.9 7.1 7.2  HGB 14.5 13.1 13.3  HCT 41.7 38.1* 39.0  MCV 84.6 86.2 86.3  PLT 178 159 165   Cardiac Enzymes: No results for input(s): CKTOTAL, CKMB, CKMBINDEX, TROPONINI in the last 168 hours. BNP: Invalid input(s): POCBNP CBG: No results for input(s): GLUCAP in the last 168 hours. Microbiology: No results found for: CULT No results for input(s): CULT, SDES in the last 168 hours.  Imaging: Dg Chest 2 View  Result Date: 02/01/2017 CLINICAL DATA:  Chest pain for 2 days. History of hypertension, diabetes, coronary artery disease, MI. EXAM: CHEST  2 VIEW COMPARISON:  Chest x-rays dated 08/07/2015 10/04/2012. FINDINGS: Heart size and mediastinal contours are stable. Median sternotomy wires appear intact and stable in alignment. Lungs are clear. No pleural effusion or pneumothorax seen. No acute or suspicious osseous finding. IMPRESSION: No active cardiopulmonary disease. No evidence of pneumonia or pulmonary edema. Electronically Signed   By: Anne Ng.D.  On: 02/01/2017 14:05    ASSESSMENT:   1. Unstable angina 2. CAD s/p s/p CABG x 2, re-do CABG x 1, LHC 07/2015 with occlused SVG-RCA that was medically managed 3. HTN  4. DM2  PLAN/DISCUSSION:  Remains stable. No further CP. Troponin negative. Continue ASA, statin, Ranexa, heparin, imdur.. Brilinta added.   Hgb stable on heparin gtt. Pharmacy dosing.   On cath board for tomorrow.    LOS: 1 day    Arvilla MeresBensimhon, Jone Panebianco, MD 02/02/2017, 9:57 AM

## 2017-02-02 NOTE — Progress Notes (Signed)
ANTICOAGULATION CONSULT NOTE - Follow Up Consult  Pharmacy Consult for Heparin  Indication: chest pain/ACS  No Known Allergies  Patient Measurements: Height: 5\' 5"  (165.1 cm) Weight: 177 lb (80.3 kg) IBW/kg (Calculated) : 61.5  Vital Signs: Temp: 96.1 F (35.6 C) (05/12 2255) Temp Source: Axillary (05/12 2255) BP: 116/65 (05/12 2255) Pulse Rate: 52 (05/12 2255)  Labs:  Recent Labs  02/01/17 1233 02/02/17 0106  HGB 14.5 13.1  HCT 41.7 38.1*  PLT 178 159  HEPARINUNFRC  --  0.63  CREATININE 0.86  --     Estimated Creatinine Clearance: 84.7 mL/min (by C-G formula based on SCr of 0.86 mg/dL).    Assessment: Heparin for CP, cath Monday, initial heparin level good  Goal of Therapy:  Heparin level 0.3-0.7 units/ml Monitor platelets by anticoagulation protocol: Yes   Plan:  -Cont heparin 1100 units/hr -1200 HL  Abran DukeLedford, Hayde Kilgour 02/02/2017,2:06 AM

## 2017-02-03 ENCOUNTER — Encounter (HOSPITAL_COMMUNITY): Payer: Self-pay | Admitting: Cardiovascular Disease

## 2017-02-03 ENCOUNTER — Encounter (HOSPITAL_COMMUNITY)
Admission: EM | Disposition: A | Discharge status: Home / Self Care | Payer: Self-pay | Source: Home / Self Care | Attending: Emergency Medicine

## 2017-02-03 DIAGNOSIS — I25110 Atherosclerotic heart disease of native coronary artery with unstable angina pectoris: Secondary | ICD-10-CM | POA: Diagnosis not present

## 2017-02-03 DIAGNOSIS — I25750 Atherosclerosis of native coronary artery of transplanted heart with unstable angina: Secondary | ICD-10-CM

## 2017-02-03 HISTORY — PX: LEFT HEART CATH AND CORS/GRAFTS ANGIOGRAPHY: CATH118250

## 2017-02-03 LAB — BASIC METABOLIC PANEL
Anion gap: 7 (ref 5–15)
BUN: 18 mg/dL (ref 6–20)
CO2: 27 mmol/L (ref 22–32)
CREATININE: 1.04 mg/dL (ref 0.61–1.24)
Calcium: 8.8 mg/dL — ABNORMAL LOW (ref 8.9–10.3)
Chloride: 104 mmol/L (ref 101–111)
GFR calc non Af Amer: >60 mL/min (ref >60)
Glucose, Bld: 120 mg/dL — ABNORMAL HIGH (ref 65–99)
Potassium: 4.5 mmol/L (ref 3.5–5.1)
SODIUM: 138 mmol/L (ref 135–145)

## 2017-02-03 LAB — HEMOGLOBIN A1C
Hgb A1c MFr Bld: 6.2 % — ABNORMAL HIGH (ref 4.8–5.6)
Mean Plasma Glucose: 131 mg/dL

## 2017-02-03 LAB — PROTIME-INR
INR: 1.04
Prothrombin Time: 13.6 seconds (ref 11.4–15.2)

## 2017-02-03 LAB — CBC
HEMATOCRIT: 39.3 % (ref 39.0–52.0)
Hemoglobin: 13.3 g/dL (ref 13.0–17.0)
MCH: 29.3 pg (ref 26.0–34.0)
MCHC: 33.8 g/dL (ref 30.0–36.0)
MCV: 86.6 fL (ref 78.0–100.0)
Platelets: 151 10*3/uL (ref 150–400)
RBC: 4.54 MIL/uL (ref 4.22–5.81)
RDW: 12.9 % (ref 11.5–15.5)
WBC: 7.4 10*3/uL (ref 4.0–10.5)

## 2017-02-03 LAB — HEPARIN LEVEL (UNFRACTIONATED): HEPARIN UNFRACTIONATED: 0.46 [IU]/mL (ref 0.30–0.70)

## 2017-02-03 SURGERY — LEFT HEART CATH AND CORS/GRAFTS ANGIOGRAPHY
Anesthesia: LOCAL

## 2017-02-03 MED ORDER — MIDAZOLAM HCL 2 MG/2ML IJ SOLN
INTRAMUSCULAR | Status: DC | PRN
  Administered 2017-02-03: 1 mg via INTRAVENOUS

## 2017-02-03 MED ORDER — FENTANYL CITRATE (PF) 100 MCG/2ML IJ SOLN
INTRAMUSCULAR | Status: AC
  Filled 2017-02-03: qty 2

## 2017-02-03 MED ORDER — ONDANSETRON HCL 4 MG/2ML IJ SOLN: 4.0000 mg | Freq: Four times a day (QID) | INTRAMUSCULAR | Status: DC | PRN

## 2017-02-03 MED ORDER — TICAGRELOR 90 MG PO TABS: 90.0000 mg | ORAL_TABLET | Freq: Two times a day (BID) | ORAL | 11 refills | Status: AC

## 2017-02-03 MED ORDER — NITROGLYCERIN 0.4 MG SL SUBL: 0.4000 mg | SUBLINGUAL_TABLET | SUBLINGUAL | 3 refills | Status: AC | PRN

## 2017-02-03 MED ORDER — SODIUM CHLORIDE 0.9% FLUSH: 3.0000 mL | INTRAVENOUS | Status: DC | PRN

## 2017-02-03 MED ORDER — SODIUM CHLORIDE 0.9 % IV SOLN: 250.0000 mL | INTRAVENOUS | Status: DC | PRN

## 2017-02-03 MED ORDER — ASPIRIN 81 MG PO CHEW
81.0000 mg | CHEWABLE_TABLET | Freq: Every day | ORAL | Status: DC
Start: 2017-02-03 — End: 2017-02-03

## 2017-02-03 MED ORDER — HEPARIN (PORCINE) IN NACL 2-0.9 UNIT/ML-% IJ SOLN
INTRAMUSCULAR | Status: AC
  Filled 2017-02-03: qty 1000

## 2017-02-03 MED ORDER — MORPHINE SULFATE (PF) 2 MG/ML IV SOLN: 2.0000 mg | INTRAVENOUS | Status: DC | PRN

## 2017-02-03 MED ORDER — MIDAZOLAM HCL 2 MG/2ML IJ SOLN
INTRAMUSCULAR | Status: AC
  Filled 2017-02-03: qty 2

## 2017-02-03 MED ORDER — HEPARIN (PORCINE) IN NACL 2-0.9 UNIT/ML-% IJ SOLN
INTRAMUSCULAR | Status: AC | PRN
  Administered 2017-02-03: 1000 mL

## 2017-02-03 MED ORDER — ROSUVASTATIN CALCIUM 10 MG PO TABS: 40.0000 mg | ORAL_TABLET | Freq: Every day | ORAL | Status: DC

## 2017-02-03 MED ORDER — ISOSORBIDE MONONITRATE ER 30 MG PO TB24: 30.0000 mg | ORAL_TABLET | Freq: Every day | ORAL | 2 refills | Status: AC

## 2017-02-03 MED ORDER — IOPAMIDOL (ISOVUE-370) INJECTION 76%
INTRAVENOUS | Status: AC
  Filled 2017-02-03: qty 100

## 2017-02-03 MED ORDER — ACETAMINOPHEN 325 MG PO TABS: 650.0000 mg | ORAL_TABLET | ORAL | Status: DC | PRN

## 2017-02-03 MED ORDER — TICAGRELOR 90 MG PO TABS: 90.0000 mg | ORAL_TABLET | Freq: Two times a day (BID) | ORAL | 0 refills | Status: DC

## 2017-02-03 MED ORDER — CLOPIDOGREL BISULFATE 75 MG PO TABS: 75.0000 mg | ORAL_TABLET | Freq: Every day | ORAL | 1 refills | Status: DC

## 2017-02-03 MED ORDER — FENTANYL CITRATE (PF) 100 MCG/2ML IJ SOLN
INTRAMUSCULAR | Status: DC | PRN
  Administered 2017-02-03: 25 ug via INTRAVENOUS

## 2017-02-03 MED ORDER — LIDOCAINE HCL (PF) 1 % IJ SOLN
INTRAMUSCULAR | Status: AC
  Filled 2017-02-03: qty 30

## 2017-02-03 MED ORDER — LIDOCAINE HCL (PF) 1 % IJ SOLN
INTRAMUSCULAR | Status: DC | PRN
  Administered 2017-02-03: 30 mL

## 2017-02-03 MED ORDER — SODIUM CHLORIDE 0.9 % IV SOLN: INTRAVENOUS | Status: AC

## 2017-02-03 MED ORDER — IOPAMIDOL (ISOVUE-370) INJECTION 76%
INTRAVENOUS | Status: DC | PRN
  Administered 2017-02-03: 65 mL via INTRA_ARTERIAL

## 2017-02-03 MED ORDER — SODIUM CHLORIDE 0.9% FLUSH: 3.0000 mL | Freq: Two times a day (BID) | INTRAVENOUS | Status: DC

## 2017-02-03 SURGICAL SUPPLY — 8 items
CATH INFINITI 5FR MULTPACK ANG (CATHETERS) ×2 IMPLANT
DEVICE CLOSURE MYNXGRIP 5F (Vascular Products) ×2 IMPLANT
KIT HEART LEFT (KITS) ×2 IMPLANT
PACK CARDIAC CATHETERIZATION (CUSTOM PROCEDURE TRAY) ×2 IMPLANT
SHEATH PINNACLE 5F 10CM (SHEATH) ×2 IMPLANT
SYR MEDRAD MARK V 150ML (SYRINGE) ×2 IMPLANT
TRANSDUCER W/STOPCOCK (MISCELLANEOUS) ×2 IMPLANT
WIRE EMERALD 3MM-J .035X150CM (WIRE) ×2 IMPLANT

## 2017-02-03 NOTE — Progress Notes (Signed)
Progress Note  Patient Name: Seth Sanchez Date of Encounter: 02/03/2017  Primary Cardiologist: Dr. Tresa Endo  Subjective   Patient is feeling well; denies chest pain, SOB, and palpitations.  Inpatient Medications    Scheduled Meds: . amLODipine  5 mg Oral Daily  . aspirin EC  81 mg Oral Daily  . ezetimibe  10 mg Oral Daily  . finasteride  1 mg Oral Daily  . isosorbide mononitrate  30 mg Oral Daily  . metoprolol  50 mg Oral BID  . ramipril  5 mg Oral Daily  . ranolazine  500 mg Oral BID  . rosuvastatin  40 mg Oral q1800  . sodium chloride flush  3 mL Intravenous Q12H  . sodium chloride flush  3 mL Intravenous Q12H  . ticagrelor  90 mg Oral BID   Continuous Infusions: . sodium chloride    . sodium chloride    . sodium chloride 1 mL/kg/hr (02/03/17 0700)  . heparin 1,100 Units/hr (02/02/17 1504)   PRN Meds: sodium chloride, sodium chloride, acetaminophen, ALPRAZolam, nitroGLYCERIN, ondansetron (ZOFRAN) IV, sodium chloride flush, sodium chloride flush   Vital Signs    Vitals:   02/02/17 1142 02/02/17 1143 02/02/17 1956 02/03/17 0415  BP: 117/69 117/69 (!) 103/46 (!) 98/48  Pulse:  63 (!) 56 60  Resp:  16 15 20   Temp:  98.6 F (37 C) 98.6 F (37 C) 98.3 F (36.8 C)  TempSrc:  Axillary    SpO2:  98% 97% 98%  Weight:      Height:        Intake/Output Summary (Last 24 hours) at 02/03/17 0731 Last data filed at 02/03/17 0500  Gross per 24 hour  Intake           998.42 ml  Output              950 ml  Net            48.42 ml   Filed Weights   02/01/17 1817 02/02/17 0533  Weight: 177 lb (80.3 kg) 175 lb 12.8 oz (79.7 kg)     Physical Exam   General: Well developed, well nourished, male appearing in no acute distress. Head: Normocephalic, atraumatic.  Neck: Supple without bruits, no JVD. Lungs:  Resp regular and unlabored, CTA. Heart: RRR, S1, S2, no murmur; no rub. Abdomen: Soft, non-tender, non-distended with normoactive bowel sounds. No hepatomegaly.  No rebound/guarding. No obvious abdominal masses. Extremities: No clubbing, cyanosis, no edema. Distal pedal pulses are 2+ bilaterally. Neuro: Alert and oriented X 3. Moves all extremities spontaneously. Psych: Normal affect.  Labs    Chemistry Recent Labs Lab 02/01/17 1233 02/02/17 0656 02/03/17 0521  NA 137 138 138  K 3.8 4.1 4.5  CL 107 106 104  CO2 24 25 27   GLUCOSE 111* 132* 120*  BUN 15 15 18   CREATININE 0.86 0.96 1.04  CALCIUM 9.1 8.8* 8.8*  GFRNONAA >60 >60 >60  GFRAA >60 >60 >60  ANIONGAP 6 7 7      Hematology Recent Labs Lab 02/02/17 0106 02/02/17 0656 02/03/17 0521  WBC 7.1 7.2 7.4  RBC 4.42 4.52 4.54  HGB 13.1 13.3 13.3  HCT 38.1* 39.0 39.3  MCV 86.2 86.3 86.6  MCH 29.6 29.4 29.3  MCHC 34.4 34.1 33.8  RDW 12.8 13.0 12.9  PLT 159 165 151    Cardiac EnzymesNo results for input(s): TROPONINI in the last 168 hours.  Recent Labs Lab 02/01/17 1238  TROPIPOC 0.03  BNPNo results for input(s): BNP, PROBNP in the last 168 hours.   DDimer No results for input(s): DDIMER in the last 168 hours.   Radiology    Dg Chest 2 View  Result Date: 02/01/2017 CLINICAL DATA:  Chest pain for 2 days. History of hypertension, diabetes, coronary artery disease, MI. EXAM: CHEST  2 VIEW COMPARISON:  Chest x-rays dated 08/07/2015 10/04/2012. FINDINGS: Heart size and mediastinal contours are stable. Median sternotomy wires appear intact and stable in alignment. Lungs are clear. No pleural effusion or pneumothorax seen. No acute or suspicious osseous finding. IMPRESSION: No active cardiopulmonary disease. No evidence of pneumonia or pulmonary edema. Electronically Signed   By: Bary RichardStan  Maynard M.D.   On: 02/01/2017 14:05     Telemetry    NSR - Personally Reviewed  ECG    No new tracings - Personally Reviewed   Cardiac Studies   EKG 02/01/17: NSR  Left heart cath 08/08/15:  Prox RCA to Mid RCA lesion, 100% stenosed. The lesion was previously treated with a stent  (unknown type).  Mid LAD lesion, 100% stenosed.  SVG was injected .  There is severe disease in the graft.  Prox Graft lesion, 100% stenosed.  LIMA was injected is normal in caliber, and is anatomically normal.  The left ventricular systolic function is normal.  Low-normal global ejection fraction at 50-55% with only a very small region of very mild inferobasal hypocontractility.  Native coronary obstructive disease with old total occlusion of the LAD immediately after the takeoff of the first diagonal and septal perforating artery; normal-appearing left circumflex vessel; and old total occlusion of the native proximal RCA at the stented segments. There is evidence for faint collateralization to the distal RCA from the circumflex vessel, and collateralization to RV marginal branches from the LIMA to LAD injection.  Widely patent LIMA graft supplying the LAD with evidence for collateralization to the stented segment of the RCA via marginal branches.  New ostial occlusion of the vein graft from 2012 which had supplied the RCA.  Previously documented occlusion of the graft which had supplied and OM 2 vessel.  RECOMMENDATION: The vein graft from Mr. Stayer's 2012 surgery which had supplied his native RCA has most likely occluded recently at the ostium, which accounts for his recurrent anginal symptomatology. His LV function is fairly well preserved. Increased medical regimen is recommended with the addition of nitrates and ranolazine to be added his beta blocker and Ace inhibition. Aggressive statin therapy.   Patient Profile     66 y.o. male with significant CAD history s/p CABG x 2 (LIMA-LAD, SVG-OM, 1995), multiple PCIs, re-do CABG x 1 (SVG-RCA, 2012), most recent left heart cath in 2016 with new occlusion of SVG-RCA with patent LIMA-LAD with collaterals to the RCA that was medically treated at that time, HTN, HLD, and DM. [facility].  Assessment &  Plan    1. CAD with unstable angina s/p CABG x 2, re-do CABG x 1, LHC 07/2015 with occluded SVG-RCA that was medically managed - troponin POC 0.03 - EKG without signs of ischemia - given his history of CAD and symptoms of unstable angina, he was admitted.  troponins are negative  - heparin gtt - continue home medications ASA, norvasc, imdur, lopressor, ramipril, crestor, and ranexa - plan for heart cath today; pt did not require nitro overnight   2. HTN - pressure has been marginal - continue home medications as above   3. DM - will start  SSI - currently diet controlled - last A1c 6.2 (2015) - repeat A1c pending   Signed, Marcelino Duster , PA-C 7:31 AM 02/03/2017 Pager: [phone]   Attending Note:   The patient was seen and examined.  Agree with assessment and plan as noted above.  Changes made to the above note as needed.  Patient seen and independently examined with Bettina Gavia, PA .   We discussed all aspects of the encounter. I agree with the assessment and plan as stated above.  1. Unstable angina / CAD :   Presents now with symptoms that are c/w unstable angina  New onset CP with exertion.  Agree with plans for cath     I have spent a total of 40 minutes with patient reviewing hospital  notes , telemetry, EKGs, labs and examining patient as well as establishing an assessment and plan that was discussed with the patient. > 50% of time was spent in direct patient care.    Vesta Mixer, Montez Hageman., MD, [facility] 02/03/2017, 12:03 PM [address],  [address] Office [phone] Pager [phone]

## 2017-02-03 NOTE — Discharge Instructions (Signed)
Cath Site Care Refer to this sheet in the next few weeks. These instructions provide you with information about caring for yourself after your procedure. Your health care provider may also give you more specific instructions. Your treatment has been planned according to current medical practices, but problems sometimes occur. Call your health care provider if you have any problems or questions after your procedure. What can I expect after the procedure? After your procedure, it is typical to have the following:  Bruising at the radial site that usually fades within 1-2 weeks.  Blood collecting in the tissue (hematoma) that may be painful to the touch. It should usually decrease in size and tenderness within 1-2 weeks. Follow these instructions at home:  Take medicines only as directed by your health care provider.  You may shower 24-48 hours after the procedure or as directed by your health care provider. Remove the bandage (dressing) and gently wash the site with plain soap and water. Pat the area dry with a clean towel. Do not rub the site, because this may cause bleeding.  Do not take baths, swim, or use a hot tub until your health care provider approves.  Check your insertion site every day for redness, swelling, or drainage.  Do not apply powder or lotion to the site.  Do not flex or bend the affected extremity for 24 hours or as directed by your health care provider.  Do not push or pull heavy objects for 24 hours or as directed by your health care provider.  Do not lift over 10 lb (4.5 kg) for 5 days after your procedure or as directed by your health care provider.  Ask your health care provider when it is okay to:  Return to work or school.  Resume usual physical activities or sports.  Resume sexual activity.  Do not drive home if you are discharged the same day as the procedure. Have someone else drive you.  You may drive 24 hours after the procedure unless otherwise  instructed by your health care provider.  Do not operate machinery or power tools for 24 hours after the procedure.  If your procedure was done as an outpatient procedure, which means that you went home the same day as your procedure, a responsible adult should be with you for the first 24 hours after you arrive home.  Keep all follow-up visits as directed by your health care provider. This is important. Contact a health care provider if:  You have a fever.  You have chills.  You have increased bleeding from the radial site. Hold pressure on the site. Get help right away if:  You have unusual pain at the radial site.  You have redness, warmth, or swelling at the radial site.  You have drainage (other than a small amount of blood on the dressing) from the radial site.  The radial site is bleeding, and the bleeding does not stop after 30 minutes of holding steady pressure on the site.  Your arm or hand becomes pale, cool, tingly, or numb. This information is not intended to replace advice given to you by your health care provider. Make sure you discuss any questions you have with your health care provider. Document Released: 10/12/2010 Document Revised: 02/15/2016 Document Reviewed: 03/28/2014 Elsevier Interactive Patient Education  2017 [employer]

## 2017-02-03 NOTE — H&P (View-Only) (Signed)
Progress Note  Patient Name: Seth Sanchez Date of Encounter: 02/03/2017  Primary Cardiologist: Dr. Tresa Endo  Subjective   Patient is feeling well; denies chest pain, SOB, and palpitations.  Inpatient Medications    Scheduled Meds: . amLODipine  5 mg Oral Daily  . aspirin EC  81 mg Oral Daily  . ezetimibe  10 mg Oral Daily  . finasteride  1 mg Oral Daily  . isosorbide mononitrate  30 mg Oral Daily  . metoprolol  50 mg Oral BID  . ramipril  5 mg Oral Daily  . ranolazine  500 mg Oral BID  . rosuvastatin  40 mg Oral q1800  . sodium chloride flush  3 mL Intravenous Q12H  . sodium chloride flush  3 mL Intravenous Q12H  . ticagrelor  90 mg Oral BID   Continuous Infusions: . sodium chloride    . sodium chloride    . sodium chloride 1 mL/kg/hr (02/03/17 0700)  . heparin 1,100 Units/hr (02/02/17 1504)   PRN Meds: sodium chloride, sodium chloride, acetaminophen, ALPRAZolam, nitroGLYCERIN, ondansetron (ZOFRAN) IV, sodium chloride flush, sodium chloride flush   Vital Signs    Vitals:   02/02/17 1142 02/02/17 1143 02/02/17 1956 02/03/17 0415  BP: 117/69 117/69 (!) 103/46 (!) 98/48  Pulse:  63 (!) 56 60  Resp:  16 15 20   Temp:  98.6 F (37 C) 98.6 F (37 C) 98.3 F (36.8 C)  TempSrc:  Axillary    SpO2:  98% 97% 98%  Weight:      Height:        Intake/Output Summary (Last 24 hours) at 02/03/17 0731 Last data filed at 02/03/17 0500  Gross per 24 hour  Intake           998.42 ml  Output              950 ml  Net            48.42 ml   Filed Weights   02/01/17 1817 02/02/17 0533  Weight: 177 lb (80.3 kg) 175 lb 12.8 oz (79.7 kg)     Physical Exam   General: Well developed, well nourished, male appearing in no acute distress. Head: Normocephalic, atraumatic.  Neck: Supple without bruits, no JVD. Lungs:  Resp regular and unlabored, CTA. Heart: RRR, S1, S2, no murmur; no rub. Abdomen: Soft, non-tender, non-distended with normoactive bowel sounds. No hepatomegaly.  No rebound/guarding. No obvious abdominal masses. Extremities: No clubbing, cyanosis, no edema. Distal pedal pulses are 2+ bilaterally. Neuro: Alert and oriented X 3. Moves all extremities spontaneously. Psych: Normal affect.  Labs    Chemistry Recent Labs Lab 02/01/17 1233 02/02/17 0656 02/03/17 0521  NA 137 138 138  K 3.8 4.1 4.5  CL 107 106 104  CO2 24 25 27   GLUCOSE 111* 132* 120*  BUN 15 15 18   CREATININE 0.86 0.96 1.04  CALCIUM 9.1 8.8* 8.8*  GFRNONAA >60 >60 >60  GFRAA >60 >60 >60  ANIONGAP 6 7 7      Hematology Recent Labs Lab 02/02/17 0106 02/02/17 0656 02/03/17 0521  WBC 7.1 7.2 7.4  RBC 4.42 4.52 4.54  HGB 13.1 13.3 13.3  HCT 38.1* 39.0 39.3  MCV 86.2 86.3 86.6  MCH 29.6 29.4 29.3  MCHC 34.4 34.1 33.8  RDW 12.8 13.0 12.9  PLT 159 165 151    Cardiac EnzymesNo results for input(s): TROPONINI in the last 168 hours.  Recent Labs Lab 02/01/17 1238  TROPIPOC 0.03  BNPNo results for input(s): BNP, PROBNP in the last 168 hours.   DDimer No results for input(s): DDIMER in the last 168 hours.   Radiology    Dg Chest 2 View  Result Date: 02/01/2017 CLINICAL DATA:  Chest pain for 2 days. History of hypertension, diabetes, coronary artery disease, MI. EXAM: CHEST  2 VIEW COMPARISON:  Chest x-rays dated 08/07/2015 10/04/2012. FINDINGS: Heart size and mediastinal contours are stable. Median sternotomy wires appear intact and stable in alignment. Lungs are clear. No pleural effusion or pneumothorax seen. No acute or suspicious osseous finding. IMPRESSION: No active cardiopulmonary disease. No evidence of pneumonia or pulmonary edema. Electronically Signed   By: Bary RichardStan  Maynard M.D.   On: 02/01/2017 14:05     Telemetry    NSR - Personally Reviewed  ECG    No new tracings - Personally Reviewed   Cardiac Studies   EKG 02/01/17: NSR  Left heart cath 08/08/15:  Prox RCA to Mid RCA lesion, 100% stenosed. The lesion was previously treated with a stent  (unknown type).  Mid LAD lesion, 100% stenosed.  SVG was injected .  There is severe disease in the graft.  Prox Graft lesion, 100% stenosed.  LIMA was injected is normal in caliber, and is anatomically normal.  The left ventricular systolic function is normal.  Low-normal global ejection fraction at 50-55% with only a very small region of very mild inferobasal hypocontractility.  Native coronary obstructive disease with old total occlusion of the LAD immediately after the takeoff of the first diagonal and septal perforating artery; normal-appearing left circumflex vessel; and old total occlusion of the native proximal RCA at the stented segments. There is evidence for faint collateralization to the distal RCA from the circumflex vessel, and collateralization to RV marginal branches from the LIMA to LAD injection.  Widely patent LIMA graft supplying the LAD with evidence for collateralization to the stented segment of the RCA via marginal branches.  New ostial occlusion of the vein graft from 2012 which had supplied the RCA.  Previously documented occlusion of the graft which had supplied and OM 2 vessel.  RECOMMENDATION: The vein graft from Mr. Stayer's 2012 surgery which had supplied his native RCA has most likely occluded recently at the ostium, which accounts for his recurrent anginal symptomatology. His LV function is fairly well preserved. Increased medical regimen is recommended with the addition of nitrates and ranolazine to be added his beta blocker and Ace inhibition. Aggressive statin therapy.   Patient Profile     66 y.o. male with significant CAD history s/p CABG x 2 (LIMA-LAD, SVG-OM, 1995), multiple PCIs, re-do CABG x 1 (SVG-RCA, 2012), most recent left heart cath in 2016 with new occlusion of SVG-RCA with patent LIMA-LAD with collaterals to the RCA that was medically treated at that time, HTN, HLD, and DM. [facility].  Assessment &  Plan    1. CAD with unstable angina s/p CABG x 2, re-do CABG x 1, LHC 07/2015 with occluded SVG-RCA that was medically managed - troponin POC 0.03 - EKG without signs of ischemia - given his history of CAD and symptoms of unstable angina, he was admitted.  troponins are negative  - heparin gtt - continue home medications ASA, norvasc, imdur, lopressor, ramipril, crestor, and ranexa - plan for heart cath today; pt did not require nitro overnight   2. HTN - pressure has been marginal - continue home medications as above   3. DM - will start  SSI - currently diet controlled - last A1c 6.2 (2015) - repeat A1c pending   Signed, Marcelino Duster , PA-C 7:31 AM 02/03/2017 Pager: [phone]   Attending Note:   The patient was seen and examined.  Agree with assessment and plan as noted above.  Changes made to the above note as needed.  Patient seen and independently examined with Bettina Gavia, PA .   We discussed all aspects of the encounter. I agree with the assessment and plan as stated above.  1. Unstable angina / CAD :   Presents now with symptoms that are c/w unstable angina  New onset CP with exertion.  Agree with plans for cath     I have spent a total of 40 minutes with patient reviewing hospital  notes , telemetry, EKGs, labs and examining patient as well as establishing an assessment and plan that was discussed with the patient. > 50% of time was spent in direct patient care.    Vesta Mixer, Montez Hageman., MD, [facility] 02/03/2017, 12:03 PM [address],  [address] Office [phone] Pager [phone]

## 2017-02-03 NOTE — Plan of Care (Signed)
Problem: Safety: Goal: Ability to remain free from injury will improve Outcome: Progressing Pt is independent and ambulates in room without difficulty.

## 2017-02-03 NOTE — Discharge Summary (Signed)
Discharge Summary    Patient ID: Seth Sanchez,  MRN: [id number], DOB/AGE: 65/05/1951 65 y.o.  Admit date: 02/01/2017 Discharge date: 02/03/2017   Primary Care Provider: System, Pcp Not In Primary Cardiologist: Dr. Tresa Endo  Discharge Diagnoses    Active Problems:   Dyslipidemia   CABG x 2 '95. Multiple RCA PCI's- Re Do CABG x 1 2012. Low risk Myoview Jan 2014   Essential hypertension   Diabetes mellitus- diet controlled   Unstable angina (HCC)   Allergies No Known Allergies   History of Present Illness     Seth Sanchez is a 66 yo male with significant CAD history s/p CABG x 2 (LIMA-LAD, SVG-OM, 1995), multiple PCIs, re-do CABG x 1 (SVG-RCA, 2012), most recent left heart cath in 2016 with new occlusion of SVG-RCA with patent LIMA-LAD with collaterals to the RCA that was medically treated at that time, HTN, HLD, and DM.  He currently lives in [facility] and is visiting for Mother's Day. He walks regularly and is compliant on his medications. On my interview in the ED, he states he has had some mild chest discomfort over the past week that occurred at rest and with activity that spontaneously resolved. He had misplaced his nitro SL last week and was unable to take nitro. Yesterday while walking, he felt chest pain on the left side that did not radiate anywhere and was not associated with diaphoresis, N/V, palpitations, or SOB, but was relieved with rest. He walked again with a recurrence of the chest pain that again was relieved with rest. Today, he was walking up stairs and felt the chest pain with some increased intensity rated as a 7/10 and felt like his MI in 1995. He reported to [facility] for further evaluation. He was chest pain free in the ED.  Hospital Course     Consultants: None  Pt was taken to the cath lab and underwent heart catheterization via right groin. Results as below. Mynx was deployed at the end of the procedure.  Seth Sanchez tolerated the procedure well and successfully  ambulated in the halls. He requires no supplemental oxygen.   Per Dr. Elease Hashimoto, after reviewing his cath, he recommends 81 mg ASA and plavix. Seth Sanchez was not taking his imdur. I gave a new prescription for imdur and explained this was for chest pain and blood pressure. He also stated he had lost his nitro SL bottle; I gave a new prescription for nitro as well.  Patient seen and examined by Dr. Elease Hashimoto  today and was stable for discharge. All follow up has been arranged.  If he does not keep his appt with Dr. Tresa Endo, I advised him to see his cardiologist in [facility] in approximately 1 week.  _____________  Discharge Vitals Blood pressure (!) 116/59, pulse (!) 54, temperature 98.1 F (36.7 C), temperature source Oral, resp. rate 12, height 5\' 5"  (1.651 m), weight 178 lb 4.8 oz (80.9 kg), SpO2 100 %.  Filed Weights   02/01/17 1817 02/02/17 0533 02/03/17 0746  Weight: 177 lb (80.3 kg) 175 lb 12.8 oz (79.7 kg) 178 lb 4.8 oz (80.9 kg)    Labs & Radiologic Studies    CBC  Recent Labs  02/02/17 0656 02/03/17 0521  WBC 7.2 7.4  HGB 13.3 13.3  HCT 39.0 39.3  MCV 86.3 86.6  PLT 165 151   Basic Metabolic Panel  Recent Labs  02/02/17 0656 02/03/17 0521  NA 138 138  K 4.1 4.5  CL 106  104  CO2 25 27  GLUCOSE 132* 120*  BUN 15 18  CREATININE 0.96 1.04  CALCIUM 8.8* 8.8*   Liver Function Tests No results for input(s): AST, ALT, ALKPHOS, BILITOT, PROT, ALBUMIN in the last 72 hours. No results for input(s): LIPASE, AMYLASE in the last 72 hours. Cardiac Enzymes No results for input(s): CKTOTAL, CKMB, CKMBINDEX, TROPONINI in the last 72 hours. BNP Invalid input(s): POCBNP D-Dimer No results for input(s): DDIMER in the last 72 hours. Hemoglobin A1C  Recent Labs  02/02/17 0656  HGBA1C 6.2*   Fasting Lipid Panel  Recent Labs  02/02/17 0656  CHOL 110  HDL 43  LDLCALC 53  TRIG 69  CHOLHDL 2.6   Thyroid Function Tests No results for input(s): TSH, T4TOTAL, T3FREE, THYROIDAB in  the last 72 hours.  Invalid input(s): FREET3 _____________  Dg Chest 2 View  Result Date: 02/01/2017 CLINICAL DATA:  Chest pain for 2 days. History of hypertension, diabetes, coronary artery disease, MI. EXAM: CHEST  2 VIEW COMPARISON:  Chest x-rays dated 08/07/2015 10/04/2012. FINDINGS: Heart size and mediastinal contours are stable. Median sternotomy wires appear intact and stable in alignment. Lungs are clear. No pleural effusion or pneumothorax seen. No acute or suspicious osseous finding. IMPRESSION: No active cardiopulmonary disease. No evidence of pneumonia or pulmonary edema. Electronically Signed   By: Bary Richard M.D.   On: 02/01/2017 14:05     Diagnostic Studies/Procedures    Heart catheterization 02/03/17: Seth Sanchez  anatomy is unchanged. His LIMA graft to his LAD is widely patent. The remainder of the vein grafts are occluded. The native right coronary artery is occluded. The LAD is occluded after the large first diagonal branch that fills by the LIMA graft. There are left-to-right collaterals to the  distal RCA. LV function is normal. His anatomy is completely unchanged from his prior cardiac catheterization 2 years ago.. The etiology of his chest discomfort is unclear. Continued medical therapy is recommended. A right femoral angiogram was performed and a MYNX closure device was successfully deployed achieving hemostasis. The patient left the lab in stable condition.   Heart catheterization 08/08/15:  Prox RCA to Mid RCA lesion, 100% stenosed. The lesion was previously treated with a stent (unknown type).  Mid LAD lesion, 100% stenosed.  SVG was injected .  There is severe disease in the graft.  Prox Graft lesion, 100% stenosed.  LIMA was injected is normal in caliber, and is anatomically normal.  The left ventricular systolic function is normal.   Low-normal global ejection fraction at 50-55% with only a very small region of very mild inferobasal  hypocontractility.  Native coronary obstructive disease with old total occlusion of the LAD immediately after the takeoff of the first diagonal and septal perforating artery; normal-appearing left circumflex vessel; and old total occlusion of the native proximal RCA at the stented segments.  There is evidence for faint collateralization to the distal RCA from the circumflex vessel, and collateralization to RV marginal branches from the LIMA to LAD injection.  Widely patent LIMA graft supplying the LAD with evidence for collateralization to the stented segment of the RCA via marginal branches.  New ostial occlusion of the vein graft from 2012 which had supplied the RCA.  Previously documented occlusion of the graft which had supplied and OM 2 vessel.  RECOMMENDATION: The vein graft from Seth Sanchez  2012 surgery which had supplied his native RCA has most likely occluded recently at the ostium, which accounts for his recurrent anginal symptomatology.  His LV function is fairly well preserved.  Increased medical regimen is recommended with the addition of nitrates and ranolazine to be added his beta blocker and Ace inhibition.  Aggressive statin therapy.  Disposition   Pt is being discharged home today in good condition.  Follow-up Plans & Appointments    Follow-up Information    Lennette Bihari, MD Follow up on 02/18/2017.   Specialty:  Cardiology Why:  11:00 AM for hospital follow up after cath Contact information: [address] [place] [address] [phone]          Discharge Instructions    Diet - low sodium heart healthy    Complete by:  As directed    Discharge instructions    Complete by:  As directed    No driving for 1 week. No lifting over 5 lbs for 1 week. No sexual activity for 1 week. You may return to work in 1 week. Keep procedure site clean & dry. If you notice increased pain, swelling, bleeding or pus, call/return!  You may shower, but no  soaking baths/hot tubs/pools for 1 week.   Increase activity slowly    Complete by:  As directed       Discharge Medications   Current Discharge Medication List    START taking these medications   Details  clopidogrel (PLAVIX) 75 MG tablet Take 1 tablet (75 mg total) by mouth daily. Qty: 30 tablet, Refills: 1      CONTINUE these medications which have CHANGED   Details  isosorbide mononitrate (IMDUR) 30 MG 24 hr tablet Take 1 tablet (30 mg total) by mouth daily. Qty: 30 tablet, Refills: 2    nitroGLYCERIN (NITROSTAT) 0.4 MG SL tablet Place 1 tablet (0.4 mg total) under the tongue every 5 (five) minutes as needed for chest pain. Qty: 25 tablet, Refills: 3      CONTINUE these medications which have NOT CHANGED   Details  amLODipine (NORVASC) 5 MG tablet Take 5 mg by mouth daily. Refills: 0    aspirin 81 MG tablet Take 81 mg by mouth daily.    Cholecalciferol (VITAMIN D-3) 5000 UNITS TABS Take 5,000 Units by mouth daily.     Coenzyme Q10 (COQ10) 100 MG CAPS Take 100 mg by mouth daily.     finasteride (PROPECIA) 1 MG tablet Take 1 mg by mouth daily.      metoprolol (LOPRESSOR) 50 MG tablet TAKE 1 TABLET(50 MG) BY MOUTH TWICE DAILY Qty: 15 tablet, Refills: 0    Multiple Vitamins-Minerals (EYE VITAMINS PO) Take 1 tablet by mouth daily.    rosuvastatin (CRESTOR) 40 MG tablet Take 40 mg by mouth daily. Refills: 0    ZETIA 10 MG tablet Take 10 mg by mouth daily. Refills: 12    acetaminophen (TYLENOL) 325 MG tablet Take 2 tablets (650 mg total) by mouth every 4 (four) hours as needed for headache or mild pain.    atorvastatin (LIPITOR) 40 MG tablet Take 1 tablet (40 mg total) by mouth daily. <PLEASE MAKE APPOINTMENT FOR REFILLS> Qty: 90 tablet, Refills: 0    niacin (NIASPAN) 1000 MG CR tablet Take 1 tablet (1,000 mg total) by mouth at bedtime. Qty: 90 tablet, Refills: 0    ramipril (ALTACE) 5 MG capsule TAKE 1 CAPSULE (5 MG TOTAL) BY MOUTH DAILY. Qty: 90 capsule,  Refills: 0    ranolazine (RANEXA) 500 MG 12 hr tablet Take 1 tablet (500 mg total) by mouth 2 (two) times daily. Qty: 60  tablet, Refills: 2          Outstanding Labs/Studies   None  Duration of Discharge Encounter   Greater than 30 minutes including physician time.  Signed, Roe Rutherfordngela Nicole Duke PA-C 02/03/2017, 3:36 PM   Attending Note:   The patient was seen and examined.  Agree with assessment and plan as noted above.  Changes made to the above note as needed.  Patient seen and independently examined with Seth Sanchez .   We discussed all aspects of the encounter. I agree with the assessment and plan as stated above.  1.  CAD :   Cath shows no culprit lesions.   OK for DC  Will add plavix to his ASA    I have spent a total of 40 minutes with patient reviewing hospital  notes , telemetry, EKGs, labs and examining patient as well as establishing an assessment and plan that was discussed with the patient. > 50% of time was spent in direct patient care.    Vesta MixerPhilip J. Zackary Mckeone, Montez HagemanJr., MD, [facility] IslandFACC 02/03/2017, 5:41 PM [address],  [address] Office [phone] Pager [phone]

## 2017-02-03 NOTE — Interval H&P Note (Signed)
Cath Lab Visit (complete for each Cath Lab visit)  Clinical Evaluation Leading to the Procedure:   ACS: Yes.    Non-ACS:    Anginal Classification: CCS III  Anti-ischemic medical therapy: Minimal Therapy (1 class of medications)  Non-Invasive Test Results: No non-invasive testing performed  Prior CABG: Previous CABG      History and Physical Interval Note:  02/03/2017 12:55 PM  Seth Sanchez  has presented today for surgery, with the diagnosis of unstable angina  The various methods of treatment have been discussed with the patient and family. After consideration of risks, benefits and other options for treatment, the patient has consented to  Procedure(s): Left Heart Cath and Cors/Grafts Angiography (N/A) as a surgical intervention .  The patient's history has been reviewed, patient examined, no change in status, stable for surgery.  I have reviewed the patient's chart and labs.  Questions were answered to the patient's satisfaction.     Nanetta BattyBerry, Penny Frisbie

## 2017-02-03 NOTE — Progress Notes (Signed)
Bedrest complete. Right groin level 0. Pt ambulated around entire nursing unit without difficulty. Pts groin site unchanged after ambulation.

## 2017-02-03 NOTE — Progress Notes (Signed)
Pt still on bedrest but for d/c this pm. Discussed restrictions, gradually increasing ex back to his 3 miles/qd, NTG, and CRPII. He has done CRPII previously. No diet questions.  [phone] Ethelda ChickKristan Manette Doto CES, ACSM 2:51 PM 02/03/2017

## 2017-02-03 NOTE — Progress Notes (Signed)
ANTICOAGULATION CONSULT NOTE - Follow Up Consult  Pharmacy Consult for Heparin  Indication: chest pain/ACS  No Known Allergies  Patient Measurements: Height: 5\' 5"  (165.1 cm) Weight: 178 lb 4.8 oz (80.9 kg) IBW/kg (Calculated) : 61.5  Vital Signs: Temp: 98.3 F (36.8 C) (05/14 0415) BP: 98/48 (05/14 0415) Pulse Rate: 54 (05/14 0823)  Labs:  Recent Labs  02/01/17 1233 02/02/17 0106 02/02/17 0656 02/02/17 1151 02/03/17 0521  HGB 14.5 13.1 13.3  --  13.3  HCT 41.7 38.1* 39.0  --  39.3  PLT 178 159 165  --  151  LABPROT  --   --   --   --  13.6  INR  --   --   --   --  1.04  HEPARINUNFRC  --  0.63  --  0.63 0.46  CREATININE 0.86  --  0.96  --  1.04    Estimated Creatinine Clearance: 70.3 mL/min (by C-G formula based on SCr of 1.04 mg/dL).   Assessment: [age] YOM with significant cardiac history presenting with chest pain, to start heparin for ACS.  Not on anticoagulation PTA. Heparin level therapeutic at 0463. CBC is stable, no bleeding noted. Plan is for cath today.  Goal of Therapy:  Heparin level 0.3-0.7 units/ml Monitor platelets by anticoagulation protocol: Yes   Plan:  Continue heparin drip at 1100 units/hr Daily heparin level and CBC Monitor for s/sx bleeding F/u after cath today   Loura BackJennifer Assumption, PharmD, BCPS Clinical Pharmacist Phone for today [phone] Main pharmacy - [phone] 02/03/2017 10:23 AM

## 2017-02-03 NOTE — Care Management Note (Addendum)
Case Management Note  Patient Details  Name: Seth DerryJohn Colen Sanchez MRN: [id number] Date of Birth: Feb 09, 1951  Subjective/Objective: Pt presented for chest pain. Post cardiac cath. Plan for home on Brilinta.                     Action/Plan: Benefits Check in process and will make pt aware of cost once completed. CM to provide pt with 30 day free and co pay card. No further needs from CM at this time.   Expected Discharge Date:  02/03/17               Expected Discharge Plan:  Home/Self Care  In-House Referral:  NA  Discharge planning Services  CM Consult, Medication Assistance  Post Acute Care Choice:  NA Choice offered to:  NA  DME Arranged:  N/A DME Agency:  NA  HH Arranged:  NA HH Agency:  NA  Status of Service:  Completed, signed off  If discussed at Long Length of Stay Meetings, dates discussed:    Additional Comments: 30 day free / co pay card provided.   S/W LIA @ [employer] #  [phone]   BRILINTA  90 MG BID   COVER- YES  CO-PAY- $ 25.00  TIER- 2 DRUG  PRIOR APPROVAL- NO   TICAGRELOR-NONE FORMULARY   PHARMACY : [facility] Gala LewandowskyGraves-Bigelow, Niajah Sipos Kaye, RN 02/03/2017, 3:37 PM

## 2017-02-06 ENCOUNTER — Telehealth: Payer: Self-pay | Admitting: Physician Assistant

## 2017-02-06 NOTE — Telephone Encounter (Signed)
I called and spoke with the patient about his DAPT. I told him to only take ASA and plavix, do NOT take Brilinta. He expressed understanding of this plan. I also called his pharmacy in [place] to make sure he is not filling the Brilinta and has a prescription for plavix.   Marcelino Dusterngela Nicole Duke, PA-C 02/06/2017, 2:29 PM [phone] [facility] [address] [place], [place] [id number]

## 2017-02-18 ENCOUNTER — Ambulatory Visit: Payer: PRIVATE HEALTH INSURANCE | Admitting: Cardiovascular Disease

## 2017-02-26 ENCOUNTER — Other Ambulatory Visit: Payer: Self-pay | Admitting: Cardiovascular Disease

## 2017-03-31 ENCOUNTER — Other Ambulatory Visit: Payer: Self-pay | Admitting: Cardiovascular Disease

## 2017-04-27 ENCOUNTER — Other Ambulatory Visit: Payer: Self-pay | Admitting: Cardiovascular Disease
